# Patient Record
Sex: Male | Born: 1950 | Race: White | Hispanic: No | Marital: Married | Smoking: Former smoker
Health system: Southern US, Community
[De-identification: ages and names within clinical notes are randomized; demographics above are authoritative.]

## PROBLEM LIST (undated history)

## (undated) DIAGNOSIS — F419 Anxiety disorder, unspecified: Secondary | ICD-10-CM

## (undated) DIAGNOSIS — Z8489 Family history of other specified conditions: Secondary | ICD-10-CM

## (undated) DIAGNOSIS — N189 Chronic kidney disease, unspecified: Secondary | ICD-10-CM

## (undated) DIAGNOSIS — M109 Gout, unspecified: Secondary | ICD-10-CM

## (undated) DIAGNOSIS — E119 Type 2 diabetes mellitus without complications: Secondary | ICD-10-CM

## (undated) DIAGNOSIS — M1990 Unspecified osteoarthritis, unspecified site: Secondary | ICD-10-CM

## (undated) DIAGNOSIS — I10 Essential (primary) hypertension: Secondary | ICD-10-CM

## (undated) DIAGNOSIS — R7303 Prediabetes: Secondary | ICD-10-CM

## (undated) DIAGNOSIS — E7800 Pure hypercholesterolemia, unspecified: Secondary | ICD-10-CM

## (undated) DIAGNOSIS — D649 Anemia, unspecified: Secondary | ICD-10-CM

## (undated) DIAGNOSIS — G4730 Sleep apnea, unspecified: Secondary | ICD-10-CM

## (undated) HISTORY — PX: COLONOSCOPY: SHX174

## (undated) HISTORY — DX: Unspecified osteoarthritis, unspecified site: M19.90

## (undated) HISTORY — PX: CATARACT EXTRACTION: SUR2

---
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
## 2004-12-16 ENCOUNTER — Encounter (INDEPENDENT_AMBULATORY_CARE_PROVIDER_SITE_OTHER): Payer: Self-pay | Admitting: *Deleted

## 2004-12-16 ENCOUNTER — Ambulatory Visit (HOSPITAL_COMMUNITY): Admission: RE | Admit: 2004-12-16 | Discharge: 2004-12-16 | Payer: Self-pay | Admitting: Gastroenterology

## 2008-09-19 ENCOUNTER — Encounter: Admission: RE | Admit: 2008-09-19 | Discharge: 2008-10-04 | Payer: Self-pay | Admitting: Family Medicine

## 2013-07-02 ENCOUNTER — Emergency Department (HOSPITAL_COMMUNITY)
Admission: EM | Admit: 2013-07-02 | Discharge: 2013-07-02 | Disposition: A | Discharge status: Home / Self Care | Payer: BC Managed Care – PPO | Attending: Emergency Medicine | Admitting: Emergency Medicine

## 2013-07-02 ENCOUNTER — Encounter (HOSPITAL_COMMUNITY): Payer: Self-pay | Admitting: Emergency Medicine

## 2013-07-02 ENCOUNTER — Emergency Department (HOSPITAL_COMMUNITY): Payer: BC Managed Care – PPO

## 2013-07-02 DIAGNOSIS — Z8639 Personal history of other endocrine, nutritional and metabolic disease: Secondary | ICD-10-CM | POA: Insufficient documentation

## 2013-07-02 DIAGNOSIS — E7800 Pure hypercholesterolemia, unspecified: Secondary | ICD-10-CM | POA: Insufficient documentation

## 2013-07-02 DIAGNOSIS — N509 Disorder of male genital organs, unspecified: Secondary | ICD-10-CM | POA: Insufficient documentation

## 2013-07-02 DIAGNOSIS — R109 Unspecified abdominal pain: Secondary | ICD-10-CM | POA: Insufficient documentation

## 2013-07-02 DIAGNOSIS — Z862 Personal history of diseases of the blood and blood-forming organs and certain disorders involving the immune mechanism: Secondary | ICD-10-CM | POA: Insufficient documentation

## 2013-07-02 DIAGNOSIS — Z79899 Other long term (current) drug therapy: Secondary | ICD-10-CM | POA: Insufficient documentation

## 2013-07-02 DIAGNOSIS — K4090 Unilateral inguinal hernia, without obstruction or gangrene, not specified as recurrent: Secondary | ICD-10-CM

## 2013-07-02 DIAGNOSIS — E119 Type 2 diabetes mellitus without complications: Secondary | ICD-10-CM | POA: Insufficient documentation

## 2013-07-02 DIAGNOSIS — I10 Essential (primary) hypertension: Secondary | ICD-10-CM | POA: Insufficient documentation

## 2013-07-02 DIAGNOSIS — Z87891 Personal history of nicotine dependence: Secondary | ICD-10-CM | POA: Insufficient documentation

## 2013-07-02 HISTORY — DX: Type 2 diabetes mellitus without complications: E11.9

## 2013-07-02 HISTORY — DX: Gout, unspecified: M10.9

## 2013-07-02 HISTORY — DX: Essential (primary) hypertension: I10

## 2013-07-02 HISTORY — DX: Pure hypercholesterolemia, unspecified: E78.00

## 2013-07-02 LAB — URINALYSIS, ROUTINE W REFLEX MICROSCOPIC
Bilirubin Urine: NEGATIVE
Glucose, UA: 100 mg/dL — AB
Ketones, ur: NEGATIVE mg/dL
Nitrite: NEGATIVE
Protein, ur: >300 mg/dL — AB
Specific Gravity, Urine: 1.021 (ref 1.005–1.030)
Urobilinogen, UA: 0.2 mg/dL (ref 0.0–1.0)

## 2013-07-02 LAB — CBC WITH DIFFERENTIAL/PLATELET
Basophils Relative: 0 % (ref 0–1)
Eosinophils Absolute: 0.1 10*3/uL (ref 0.0–0.7)
Eosinophils Relative: 1 % (ref 0–5)
Hemoglobin: 14 g/dL (ref 13.0–17.0)
Lymphs Abs: 1.2 10*3/uL (ref 0.7–4.0)
MCH: 30.5 pg (ref 26.0–34.0)
MCHC: 34.6 g/dL (ref 30.0–36.0)
MCV: 88.2 fL (ref 78.0–100.0)
Monocytes Relative: 6 % (ref 3–12)
Neutrophils Relative %: 82 % — ABNORMAL HIGH (ref 43–77)
RBC: 4.59 MIL/uL (ref 4.22–5.81)
RDW: 13.5 % (ref 11.5–15.5)
WBC: 9.9 10*3/uL (ref 4.0–10.5)

## 2013-07-02 LAB — BASIC METABOLIC PANEL
BUN: 21 mg/dL (ref 6–23)
Calcium: 9.1 mg/dL (ref 8.4–10.5)
Creatinine, Ser: 1.02 mg/dL (ref 0.50–1.35)
GFR calc Af Amer: >90 mL/min (ref >90)
GFR calc non Af Amer: 78 mL/min — ABNORMAL LOW (ref >90)
Glucose, Bld: 176 mg/dL — ABNORMAL HIGH (ref 70–99)

## 2013-07-02 MED ORDER — HYDROMORPHONE HCL PF 1 MG/ML IJ SOLN
1.0000 mg | Freq: Once | INTRAMUSCULAR | Status: AC
  Administered 2013-07-02: 1 mg via INTRAVENOUS
  Filled 2013-07-02: qty 1

## 2013-07-02 MED ORDER — ONDANSETRON 8 MG PO TBDP: 8.0000 mg | ORAL_TABLET | Freq: Three times a day (TID) | ORAL | Status: DC | PRN

## 2013-07-02 MED ORDER — HYDROCODONE-ACETAMINOPHEN 5-325 MG PO TABS: 1.0000 | ORAL_TABLET | Freq: Four times a day (QID) | ORAL | Status: DC | PRN

## 2013-07-02 NOTE — ED Notes (Signed)
Patient discharged to home with family. NAD.  

## 2013-07-02 NOTE — ED Notes (Signed)
Pt reports right lower back pain that radiates around to right groin, started last night and become progressively worse. Denies difficulty urinating. No hx of kidney stones.

## 2013-07-02 NOTE — ED Notes (Signed)
Patient transported to CT 

## 2013-07-02 NOTE — ED Provider Notes (Signed)
CSN: [id number]     Arrival date & time 07/02/13  1048 History   First MD Initiated Contact with Patient 07/02/13 1200     Chief Complaint  Patient presents with  . Back Pain  . Groin Pain   (Consider location/radiation/quality/duration/timing/severity/associated sxs/prior Treatment) HPI Comments: PT comes in with cc of abd pain. Hx of diabetes. States that the abd pain is right sided flank pain, radiating to groin. Pain started yday, and on occasions has been pretty severe. No trauma, no uti like sx and no hx of renal stones.  Patient is a 62 y.o. male presenting with back pain and groin pain. The history is provided by the patient.  Back Pain Associated symptoms: abdominal pain   Associated symptoms: no chest pain, no dysuria and no fever   Groin Pain Associated symptoms include abdominal pain. Pertinent negatives include no chest pain and no shortness of breath.    Past Medical History  Diagnosis Date  . Diabetes mellitus without complication   . Hypertension   . High cholesterol   . Gout    History reviewed. No pertinent past surgical history. History reviewed. No pertinent family history. History  Substance Use Topics  . Smoking status: Former Games developer  . Smokeless tobacco: Not on file  . Alcohol Use: No    Review of Systems  Constitutional: Negative for fever, chills, activity change and appetite change.  Respiratory: Negative for cough and shortness of breath.   Cardiovascular: Negative for chest pain.  Gastrointestinal: Positive for abdominal pain. Negative for nausea and vomiting.  Genitourinary: Positive for testicular pain. Negative for dysuria and scrotal swelling.  Musculoskeletal: Positive for back pain.    Allergies  Review of patient's allergies indicates no known allergies.  Home Medications   Current Outpatient Rx  Name  Route  Sig  Dispense  Refill  . acetaminophen (TYLENOL) 500 MG tablet   Oral   Take 1,000 mg by mouth every 6 (six) hours as  needed.         Marland Kitchen allopurinol (ZYLOPRIM) 300 MG tablet   Oral   Take 300 mg by mouth daily.         Marland Kitchen atorvastatin (LIPITOR) 20 MG tablet   Oral   Take 20 mg by mouth daily.         . benazepril (LOTENSIN) 20 MG tablet   Oral   Take 20 mg by mouth daily.         . fenofibrate micronized (LOFIBRA) 200 MG capsule   Oral   Take 200 mg by mouth daily before breakfast.         . metFORMIN (GLUCOPHAGE) 500 MG tablet   Oral   Take 500 mg by mouth 2 (two) times daily with a meal.         . HYDROcodone-acetaminophen (NORCO/VICODIN) 5-325 MG per tablet   Oral   Take 1 tablet by mouth every 6 (six) hours as needed.   15 tablet   0   . ondansetron (ZOFRAN ODT) 8 MG disintegrating tablet   Oral   Take 1 tablet (8 mg total) by mouth every 8 (eight) hours as needed for nausea.   20 tablet   0    BP 144/68  Pulse 63  Temp(Src) 98.2 F (36.8 C) (Oral)  Resp 20  SpO2 97% Physical Exam  Nursing note and vitals reviewed. Constitutional: He is oriented to person, place, and time. He appears well-developed.  HENT:  Head: Normocephalic and atraumatic.  Eyes:  Conjunctivae and EOM are normal. Pupils are equal, round, and reactive to light.  Neck: Normal range of motion. Neck supple.  Cardiovascular: Normal rate and regular rhythm.   Pulmonary/Chest: Effort normal and breath sounds normal.  Abdominal: Soft. Bowel sounds are normal. He exhibits no distension. There is no tenderness. There is no rebound and no guarding.  No tenderness to palpation, no hernia appreciated.  Genitourinary: No penile tenderness.  No testicular mass, non tender exam, freely moving bilateral testes. No hernia that i could appreciate.  Neurological: He is alert and oriented to person, place, and time.  Skin: Skin is warm.    ED Course  Procedures (including critical care time) Labs Review Labs Reviewed  CBC WITH DIFFERENTIAL - Abnormal; Notable for the following:    Platelets 403 (*)     Neutrophils Relative % 82 (*)    Neutro Abs 8.1 (*)    All other components within normal limits  BASIC METABOLIC PANEL - Abnormal; Notable for the following:    Sodium 134 (*)    Glucose, Bld 176 (*)    GFR calc non Af Amer 78 (*)    All other components within normal limits  URINALYSIS, ROUTINE W REFLEX MICROSCOPIC - Abnormal; Notable for the following:    Glucose, UA 100 (*)    Hgb urine dipstick TRACE (*)    Protein, ur >300 (*)    All other components within normal limits  URINE MICROSCOPIC-ADD ON   Imaging Review Ct Abdomen Pelvis Wo Contrast  07/02/2013   CLINICAL DATA:  Right-sided back and groin pain.  EXAM: CT ABDOMEN AND PELVIS WITHOUT CONTRAST  TECHNIQUE: Multidetector CT imaging of the abdomen and pelvis was performed following the standard protocol without intravenous contrast.  COMPARISON:  None.  FINDINGS: The lung bases are clear except for dependent bibasilar atelectasis. No worrisome pulmonary lesions or pleural effusion. The heart is normal in size. No pericardial effusion. The distal esophagus is unremarkable.  There is diffuse and fairly marked fatty infiltration of the liver but no focal hepatic lesions or intrahepatic biliary dilatation. The gallbladder demonstrates high attenuation material which could be vicarious excretion of contrast material or sludge. No inflammatory changes. The common bile duct is normal in caliber. The pancreas is unremarkable. The spleen is normal in size. No focal lesions. The adrenal glands are normal. Both kidneys demonstrate duplicated collecting systems and fever lobulations or scarring changes. The right kidney is small and the left kidney demonstrates compensatory hypertrophy. No renal calculi or obstructing ureteral calculi. No worrisome renal lesions. The bladder demonstrates mild diffuse wall thickening but no mass or calculus. The prostate gland and seminal vesicles are unremarkable.  The stomach, duodenum, small bowel and colon are  grossly normal without oral contrast. No inflammatory changes, mass lesions or obstructive findings. Scattered colonic diverticulosis is noted. The appendix is not identified but no findings to suggest appendicitis. No mesenteric or retroperitoneal mass or adenopathy. The aorta is normal in caliber. Scattered atherosclerotic calcifications are noted.  No pelvic mass, adenopathy or free pelvic fluid collections. No inguinal mass or adenopathy. There is a left inguinal hernia versus prominent inguinal canal containing fat.  The bony structures are unremarkable. Moderate degenerative changes noted in the lower lumbar spine and involving the SI joints.  IMPRESSION: 1. No acute abdominal/pelvic findings, mass lesions or lymphadenopathy. 2. No renal or obstructing ureteral calculi. 3. Small right kidney and compensatory hypertrophy of the left kidney. Pubic 80 collecting systems bilaterally. 4. Diffuse fatty infiltration of  the liver   Electronically Signed   By: Loralie Champagne M.D.   On: 07/02/2013 14:16    EKG Interpretation   None       MDM   1. Inguinal hernia    Pt comes in with cc of flank pan, right sided, with radiation to groin. Pain severe on occasion. No hx of renal stones. Exam is non peritoneal. GU exam was benign, no testicular torsion, epididymitis suspected. Pt's CT showed no renal stones. There is a possible There is a left inguinal hernia versus prominent inguinal canal containing fat per CT - and could be the cause of the pain. Will give surgery follow up to be on the safe side, as there is nothing that truly can explain this pain otherwise. Return precautions discussed with the patient verbally.    Derwood Kaplan, MD 07/02/13 [phone]

## 2013-07-10 ENCOUNTER — Ambulatory Visit (INDEPENDENT_AMBULATORY_CARE_PROVIDER_SITE_OTHER): Payer: BC Managed Care – PPO | Admitting: Surgery

## 2013-07-10 ENCOUNTER — Encounter (INDEPENDENT_AMBULATORY_CARE_PROVIDER_SITE_OTHER): Payer: Self-pay | Admitting: Surgery

## 2013-07-10 VITALS — BP 140/88 | HR 80 | Temp 98.6°F | Resp 14 | Ht 71.0 in | Wt 283.6 lb

## 2013-07-10 DIAGNOSIS — K4090 Unilateral inguinal hernia, without obstruction or gangrene, not specified as recurrent: Secondary | ICD-10-CM

## 2013-07-10 NOTE — Progress Notes (Signed)
Patient ID: Dwayne Gomez, male   DOB: [birth year], 62 y.o.   MRN: [id number]  Chief Complaint  Patient presents with  . New Evaluation    eval RIH    HPI Dwayne Gomez is a 62 y.o. male.  Patient sent at the request of Dr. Rosezetta Schlatter for right groin and right flank pain. He was seen a few weeks ago due to the sudden onset of lower right back pain with radiation into his right groin and right testicle. This came on suddenly without any precipitating event. He underwent CT scanning which showed no kidney stone and no explanation for the pain. He was found to have a right inguinal hernia. His pain improved with rest and now he is pain free. He does notice some swelling in his right groin. He is not having any significant groin pain at this point in time. He does have a history of back problems he states. HPI  Past Medical History  Diagnosis Date  . Diabetes mellitus without complication   . Hypertension   . High cholesterol   . Gout   . Arthritis     Past Surgical History  Procedure Laterality Date  . Appendectomy  [id number]    Family History  Problem Relation Age of Onset  . Cancer Mother     breast  . Cancer Father     prostate  . Diabetes Father     Social History History  Substance Use Topics  . Smoking status: Former Smoker    Quit date: 07/28/1975  . Smokeless tobacco: Never Used  . Alcohol Use: Yes    No Known Allergies  Current Outpatient Prescriptions  Medication Sig Dispense Refill  . acetaminophen (TYLENOL) 500 MG tablet Take 1,000 mg by mouth every 6 (six) hours as needed.      Marland Kitchen allopurinol (ZYLOPRIM) 300 MG tablet Take 300 mg by mouth daily.      Marland Kitchen atorvastatin (LIPITOR) 20 MG tablet Take 20 mg by mouth daily.      . benazepril (LOTENSIN) 20 MG tablet Take 20 mg by mouth daily.      . fenofibrate micronized (LOFIBRA) 200 MG capsule Take 200 mg by mouth daily before breakfast.      . HYDROcodone-acetaminophen (NORCO/VICODIN) 5-325 MG per tablet Take 1  tablet by mouth every 6 (six) hours as needed.  15 tablet  0  . indomethacin (INDOCIN) 25 MG capsule Take 25 mg by mouth 2 (two) times daily with a meal.      . metFORMIN (GLUCOPHAGE) 500 MG tablet Take 500 mg by mouth 2 (two) times daily with a meal.       No current facility-administered medications for this visit.    Review of Systems Review of Systems  Constitutional: Negative for fever, chills and unexpected weight change.  HENT: Negative for congestion, hearing loss, sore throat, trouble swallowing and voice change.   Eyes: Negative for visual disturbance.  Respiratory: Negative for cough and wheezing.   Cardiovascular: Negative for chest pain, palpitations and leg swelling.  Gastrointestinal: Negative for nausea, vomiting, abdominal pain, diarrhea, constipation, blood in stool, abdominal distention, anal bleeding and rectal pain.  Genitourinary: Negative for hematuria and difficulty urinating.  Musculoskeletal: Positive for back pain. Negative for arthralgias.  Skin: Negative for rash and wound.  Neurological: Negative for seizures, syncope, weakness and headaches.  Hematological: Negative for adenopathy. Does not bruise/bleed easily.  Psychiatric/Behavioral: Negative for confusion.    Blood pressure 140/88, pulse 80, temperature 98.6 F (37  C), temperature source Temporal, resp. rate 14, height 5\' 11"  (1.803 m), weight 283 lb 9.6 oz (128.64 kg).  Physical Exam Physical Exam  Constitutional: He appears well-developed and well-nourished.  HENT:  Head: Normocephalic and atraumatic.  Eyes: EOM are normal. Pupils are equal, round, and reactive to light. No scleral icterus.  Neck: Normal range of motion. Neck supple.  Cardiovascular: Normal rate and regular rhythm.   Pulmonary/Chest: Effort normal.  Abdominal: Soft. Bowel sounds are normal. There is no tenderness. A hernia is present. Hernia confirmed positive in the right inguinal area. Hernia confirmed negative in the left  inguinal area.  Musculoskeletal: Normal range of motion.  Neurological: He is alert.  Skin: Skin is warm and dry.  Psychiatric: He has a normal mood and affect. His behavior is normal. Judgment and thought content normal.    Data Reviewed CT A/P  Assessment    RIH small reducible    Plan    Discussed observation versus surgical repair mesh. The hernia small and the pain he had that prompted his visit to the emergency room may be radicular in nature. No evidence of left inguinal hernia on exam. CT scan shows no hernia as well. He will think over surgery and let me know if if he wishes to proceed. Information about open hernia repair with mesh given and questions answered. Risks, benefits and alternatives to surgery given as well.The risk of hernia repair include bleeding,  Infection,   Recurrence of the hernia,  Mesh use, chronic pain,  Organ injury,  Bowel injury,  Bladder injury,   nerve injury with numbness around the incision,  Death,  and worsening of preexisting  medical problems.  The alternatives to surgery have been discussed as well..  Long term expectations of both operative and non operative treatments have been discussed.   Pt will call to schedule. Marland Kitchen       Dwayne Gomez A. 07/10/2013, 11:49 AM

## 2013-07-10 NOTE — Patient Instructions (Signed)
Hernia, Surgical Repair A hernia occurs when an internal organ pushes out through a weak spot in the belly (abdominal) wall muscles. Hernias commonly occur in the groin and around the navel. Hernias often can be pushed back into place (reduced). Most hernias tend to get worse over time. Problems occur when abdominal contents get stuck in the opening (incarcerated hernia). The blood supply gets cut off (strangulated hernia). This is an emergency and needs surgery. Otherwise, hernia repair can be an elective procedure. This means you can schedule this at your convenience when an emergency is not present. Because complications can occur, if you decide to repair the hernia, it is best to do it soon. When it becomes an emergency procedure, there is increased risk of complications after surgery. CAUSES   Heavy lifting.  Obesity.  Prolonged coughing.  Straining to move your bowels.  Hernias can also occur through a cut (incision) by a surgeonafter an abdominal operation. HOME CARE INSTRUCTIONS Before the repair:  Bed rest is not required. You may continue your normal activities, but avoid heavy lifting (more than 10 pounds) or straining. Cough gently. If you are a smoker, it is best to stop. Even the best hernia repair can break down with the continual strain of coughing.  Do not wear anything tight over your hernia. Do not try to keep it in with an outside bandage or truss. These can damage abdominal contents if they are trapped in the hernia sac.  Eat a normal diet. Avoid constipation. Straining over long periods of time to have a bowel movement will increase hernia size. It also can breakdown repairs. If you cannot do this with diet alone, laxatives or stool softeners may be used. PRIOR TO SURGERY, SEEK IMMEDIATE MEDICAL CARE IF: You have problems (symptoms) of a trapped (incarcerated) hernia. Symptoms include:  An oral temperature above 102 F (38.9 C) develops, or as your caregiver  suggests.  Increasing abdominal pain.  Feeling sick to your stomach(nausea) and vomiting.  You stop passing gas or stool.  The hernia is stuck outside the abdomen, looks discolored, feels hard, or is tender.  You have any changes in your bowel habits or in the hernia that is unusual for you. LET YOUR CAREGIVERS KNOW ABOUT THE FOLLOWING:  Allergies.  Medications taken including herbs, eye drops, over the counter medications, and creams.  Use of steroids (by mouth or creams).  Family or personal history of problems with anesthetics or Novocaine.  Possibility of pregnancy, if this applies.  Personal history of blood clots (thrombophlebitis).  Family or personal history of bleeding or blood problems.  Previous surgery.  Other health problems. BEFORE THE PROCEDURE You should be present 1 hour prior to your procedure, or as directed by your caregiver.  AFTER THE PROCEDURE After surgery, you will be taken to the recovery area. A nurse will watch and check your progress there. Once you are awake, stable, and taking fluids well, you will be allowed to go home as long as there are no problems. Once home, an ice pack (wrapped in a light towel) applied to your operative site may help with discomfort. It may also keep the swelling down. Do not lift anything heavier than 10 pounds (4.55 kilograms). Take showers not baths. Do not drive while taking narcotics. Follow instructions as suggested by your caregiver.  SEEK IMMEDIATE MEDICAL CARE IF: After surgery:  There is redness, swelling, or increasing pain in the wound.  There is pus coming from the wound.  There is  drainage from a wound lasting longer than 1 day.  An unexplained oral temperature above 102 F (38.9 C) develops.  You notice a foul smell coming from the wound or dressing.  There is a breaking open of a wound (edged not staying together) after the sutures have been removed.  You notice increasing pain in the shoulders  (shoulder strap areas).  You develop dizzy episodes or fainting while standing.  You develop persistent nausea or vomiting.  You develop a rash.  You have difficulty breathing.  You develop any reaction or side effects to medications given. MAKE SURE YOU:   Understand these instructions.  Will watch your condition.  Will get help right away if you are not doing well or get worse. Document Released: 01/06/2001 Document Revised: 10/05/2011 Document Reviewed: 11/29/2007 [facility] Patient Information 2014 [place], [place]. Inguinal Hernia, Adult  Care After Refer to this sheet in the next few weeks. These discharge instructions provide you with general information on caring for yourself after you leave the hospital. Your caregiver may also give you specific instructions. Your treatment has been planned according to the most current medical practices available, but unavoidable complications sometimes occur. If you have any problems or questions after discharge, please call your caregiver. HOME CARE INSTRUCTIONS  Put ice on the operative site.  Put ice in a plastic bag.  Place a towel between your skin and the bag.  Leave the ice on for 15-20 minutes at a time, 03-04 times a day while awake.  Change bandages (dressings) as directed.  Keep the wound dry and clean. The wound may be washed gently with soap and water. Gently blot or dab the wound dry. It is okay to take showers 24 to 48 hours after surgery. Do not take baths, use swimming pools, or use hot tubs for 10 days, or as directed by your caregiver.  Only take over-the-counter or prescription medicines for pain, discomfort, or fever as directed by your caregiver.  Continue your normal diet as directed.  Do not lift anything more than 10 pounds or play contact sports for 3 weeks, or as directed. SEEK MEDICAL CARE IF:  There is redness, swelling, or increasing pain in the wound.  There is fluid (pus) coming from the  wound.  There is drainage from a wound lasting longer than 1 day.  You have an oral temperature above 102 F (38.9 C).  You notice a bad smell coming from the wound or dressing.  The wound breaks open after the stitches (sutures) have been removed.  You notice increasing pain in the shoulders (shoulder strap areas).  You develop dizzy episodes or fainting while standing.  You feel sick to your stomach (nauseous) or throw up (vomit). SEEK IMMEDIATE MEDICAL CARE IF:  You develop a rash.  You have difficulty breathing.  You develop a reaction or have side effects to medicines you were given. MAKE SURE YOU:   Understand these instructions.  Will watch your condition.  Will get help right away if you are not doing well or get worse. Document Released: 08/13/2006 Document Revised: 10/05/2011 Document Reviewed: 06/12/2009 [facility] Patient Information 2014 [place], [place].

## 2017-06-25 ENCOUNTER — Other Ambulatory Visit: Payer: Self-pay | Admitting: Family Medicine

## 2017-06-25 ENCOUNTER — Ambulatory Visit
Admission: RE | Admit: 2017-06-25 | Discharge: 2017-06-25 | Disposition: A | Discharge status: Home / Self Care | Payer: BC Managed Care – PPO | Source: Ambulatory Visit | Attending: Family Medicine | Admitting: Family Medicine

## 2017-06-25 DIAGNOSIS — S298XXA Other specified injuries of thorax, initial encounter: Secondary | ICD-10-CM

## 2019-08-17 ENCOUNTER — Ambulatory Visit: Payer: Medicare PPO | Attending: Internal Medicine

## 2019-08-17 DIAGNOSIS — Z23 Encounter for immunization: Secondary | ICD-10-CM | POA: Insufficient documentation

## 2019-08-17 NOTE — Progress Notes (Signed)
   Covid-19 Vaccination Clinic  Name:  Dwayne Gomez    MRN: [id number] DOB: 1951/02/02  08/17/2019  [name] was observed post Covid-19 immunization for 15 minutes without incidence. He was provided with Vaccine Information Sheet and instruction to access the V-Safe system.   [name] was instructed to call 911 with any severe reactions post vaccine: Marland Kitchen Difficulty breathing  . Swelling of your face and throat  . A fast heartbeat  . A bad rash all over your body  . Dizziness and weakness    Immunizations Administered    Name Date Dose VIS Date Route   Pfizer COVID-19 Vaccine 08/17/2019  4:52 PM 0.3 mL 07/07/2019 Intramuscular   Manufacturer: [employer], [employer]   Lot: [id number]   NDC: [address]-7

## 2019-09-07 ENCOUNTER — Ambulatory Visit: Payer: Medicare PPO | Attending: Internal Medicine

## 2019-09-07 DIAGNOSIS — Z23 Encounter for immunization: Secondary | ICD-10-CM | POA: Insufficient documentation

## 2019-09-07 NOTE — Progress Notes (Signed)
   Covid-19 Vaccination Clinic  Name:  Dwayne Gomez    MRN: [id number] DOB: [birth year]  09/07/2019  Mr. [name] was observed post Covid-19 immunization for 15 minutes without incidence. He was provided with Vaccine Information Sheet and instruction to access the V-Safe system.   Mr. [name] was instructed to call 911 with any severe reactions post vaccine: Marland Kitchen Difficulty breathing  . Swelling of your face and throat  . A fast heartbeat  . A bad rash all over your body  . Dizziness and weakness    Immunizations Administered    Name Date Dose VIS Date Route   Pfizer COVID-19 Vaccine 09/07/2019  4:31 PM 0.3 mL 07/07/2019 Intramuscular   Manufacturer: [employer], [employer]   Lot: [id number]   NDC: [address]-9

## 2020-05-13 ENCOUNTER — Other Ambulatory Visit: Payer: Self-pay | Admitting: Otolaryngology

## 2020-05-13 DIAGNOSIS — H903 Sensorineural hearing loss, bilateral: Secondary | ICD-10-CM

## 2020-06-02 ENCOUNTER — Ambulatory Visit
Admission: RE | Admit: 2020-06-02 | Discharge: 2020-06-02 | Disposition: A | Discharge status: Home / Self Care | Payer: Medicare PPO | Source: Ambulatory Visit | Attending: Otolaryngology | Admitting: Otolaryngology

## 2020-06-02 ENCOUNTER — Other Ambulatory Visit: Payer: Self-pay

## 2020-06-02 DIAGNOSIS — H903 Sensorineural hearing loss, bilateral: Secondary | ICD-10-CM

## 2020-06-24 ENCOUNTER — Other Ambulatory Visit: Payer: Self-pay | Admitting: Otolaryngology

## 2020-06-24 DIAGNOSIS — H903 Sensorineural hearing loss, bilateral: Secondary | ICD-10-CM

## 2020-07-17 ENCOUNTER — Ambulatory Visit
Admission: RE | Admit: 2020-07-17 | Discharge: 2020-07-17 | Disposition: A | Discharge status: Home / Self Care | Payer: Medicare PPO | Source: Ambulatory Visit | Attending: Otolaryngology | Admitting: Otolaryngology

## 2020-07-17 ENCOUNTER — Other Ambulatory Visit: Payer: Medicare PPO

## 2020-07-17 DIAGNOSIS — H903 Sensorineural hearing loss, bilateral: Secondary | ICD-10-CM

## 2020-07-17 MED ORDER — GADOBENATE DIMEGLUMINE 529 MG/ML IV SOLN
20.0000 mL | Freq: Once | INTRAVENOUS | Status: AC | PRN
  Administered 2020-07-17: 20 mL via INTRAVENOUS

## 2021-01-20 IMAGING — MR MR BRAIN/IAC WO/W CM
12 of 13 series · 40 of 48 positions shown · IV contrast (multihance)
Comparison: None.

CLINICAL DATA: Left hearing loss

EXAM:
MRI HEAD WITHOUT AND WITH CONTRAST
TECHNIQUE: Multiplanar, multiecho pulse sequences of the brain and surrounding
structures were obtained without and with intravenous contrast.
CONTRAST:  20mL MULTIHANCE GADOBENATE DIMEGLUMINE 529 MG/ML IV SOLN

[Series 2: T1 · sagittal · 5.0mm · 0.47mm/px · 3 of 21 slices shown (1 of 4)]
[im 1/21]
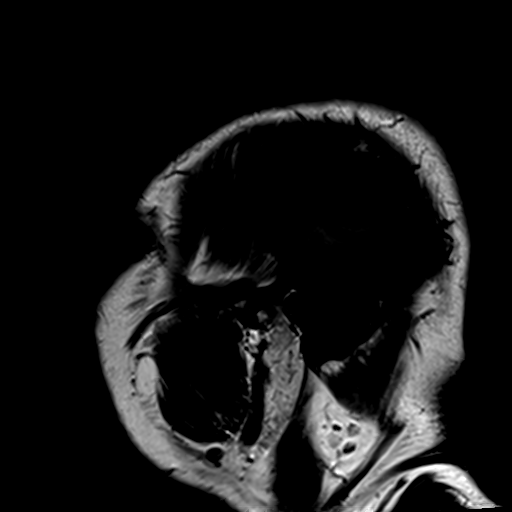
[im 11/21]
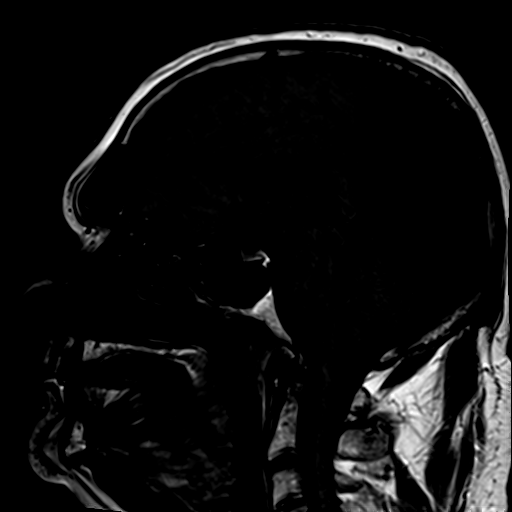
[im 21/21]
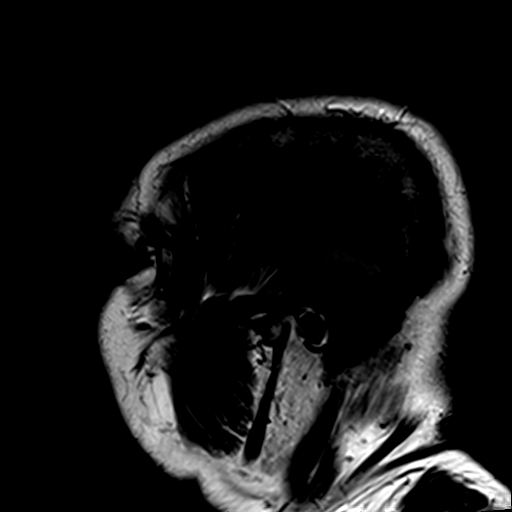

[Series 3: ep2d_diff_3 · axial · 3.0mm · 1.95mm/px · z∈[-34,+113]mm · 8 of 100 slices shown]
[im 1/100]
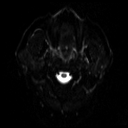
[im 13/100]
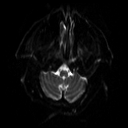
[im 25/100]
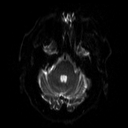
[im 38/100]
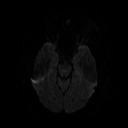
[im 62/100]
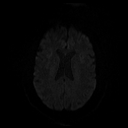
[im 75/100]
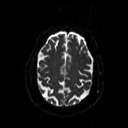
[im 87/100]
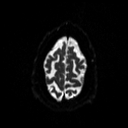
[im 100/100]
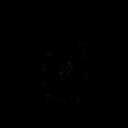

[Series 4: ep2d_diff_3_adc · axial · 3.0mm · 1.95mm/px · z∈[-34,+113]mm · 4 of 50 slices shown]
[im 1/50]
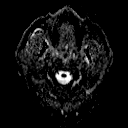
[im 17/50]
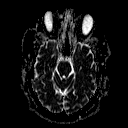
[im 33/50]
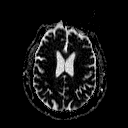
[im 50/50]
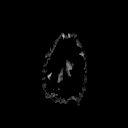

[Series 5: t2_tse_tra_512 · axial · 5.0mm · 0.62mm/px · z∈[-33,+111]mm · 2 of 24 slices shown]
[im 1/24]
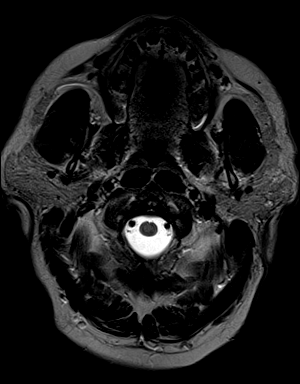
[im 24/24]
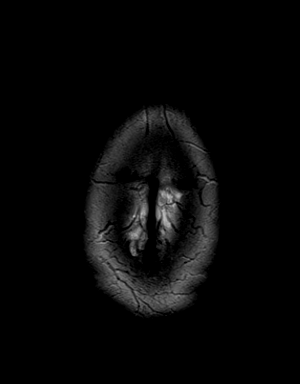

[Series 6: FLAIR · axial · 3.0mm · 0.47mm/px · z∈[-39,+117]mm · 2 of 27 slices shown]
[im 1/27]
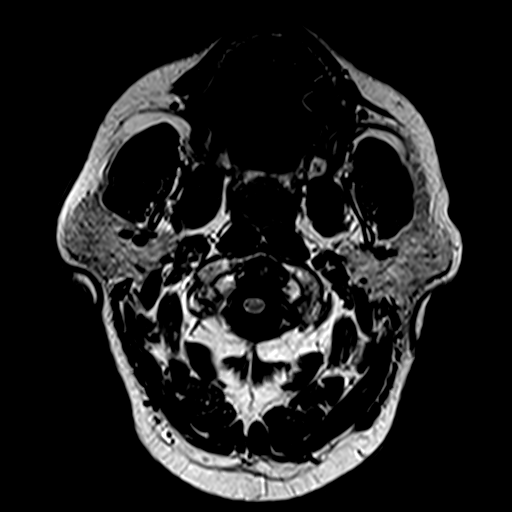
[im 27/27]
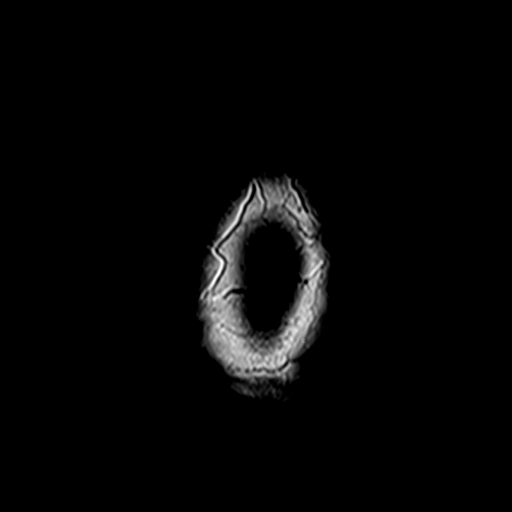

[Series 7: mip_images(sw) · axial · 16.0mm · 0.94mm/px · z∈[-33,+111]mm · 6 of 73 slices shown]
[im 1/73]
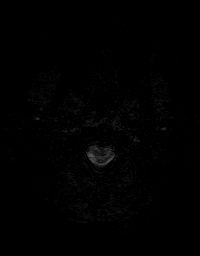
[im 15/73]
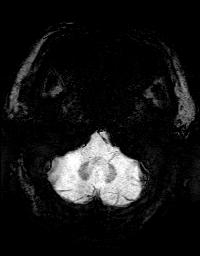
[im 29/73]
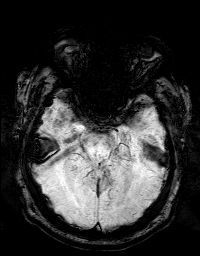
[im 44/73]
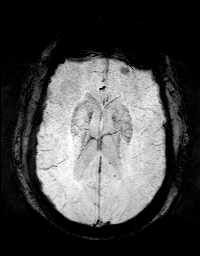
[im 58/73]
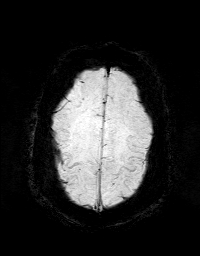
[im 73/73]
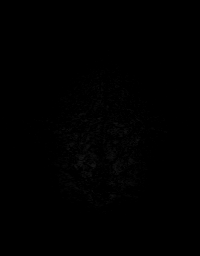

[Series 8: swi_images · axial · 2.0mm · 0.94mm/px · z∈[-40,+118]mm · 7 of 80 slices shown]
[im 1/80]
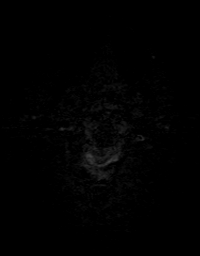
[im 14/80]
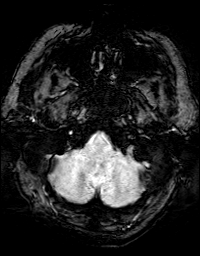
[im 27/80]
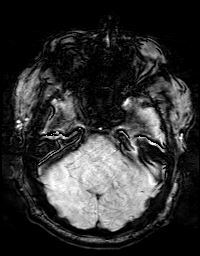
[im 40/80]
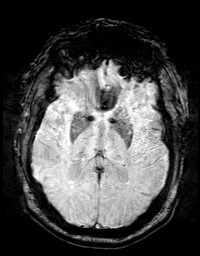
[im 53/80]
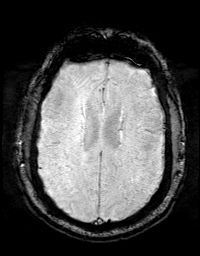
[im 66/80]
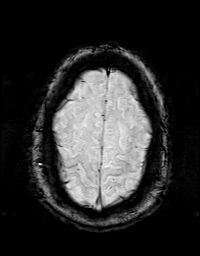
[im 80/80]
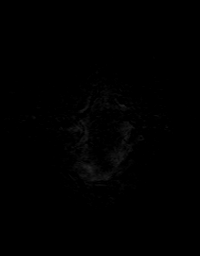

[Series 9: T1 · coronal · 3.0mm · 0.35mm/px · 1 of 11 slices shown (2 of 4)]
[im 1/11]
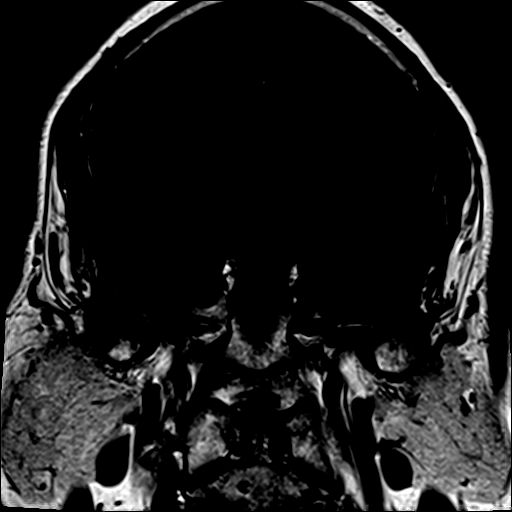

[Series 10: T1 · axial · 3.0mm · 0.35mm/px · 1 of 11 slices shown (3 of 4)]
[im 1/11]
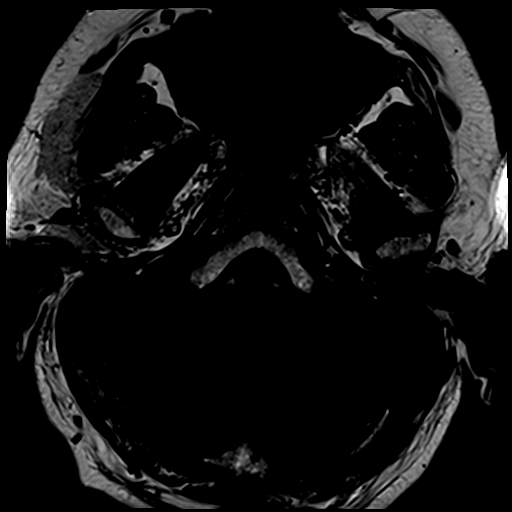

[Series 11: bSSFP · axial · 0.7mm · 0.28mm/px · z∈[-19,+11]mm · 4 of 44 slices shown]
[im 1/44]
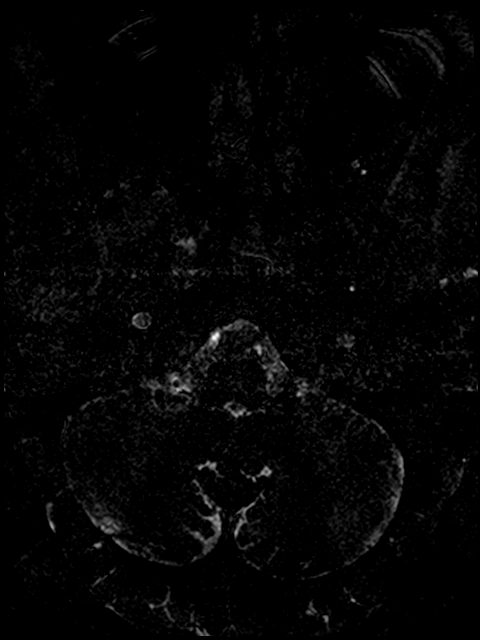
[im 15/44]
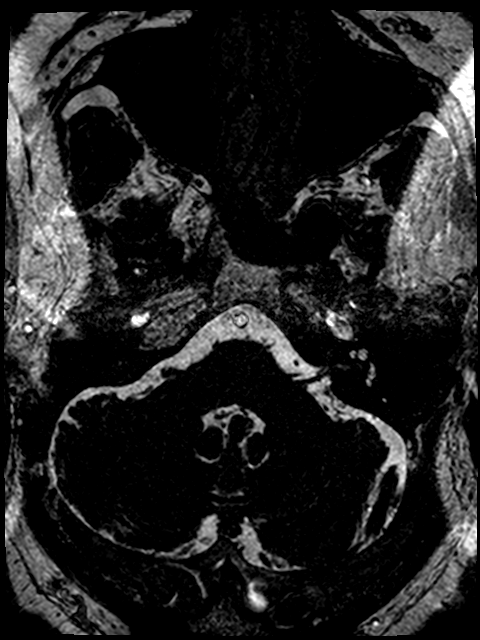
[im 29/44]
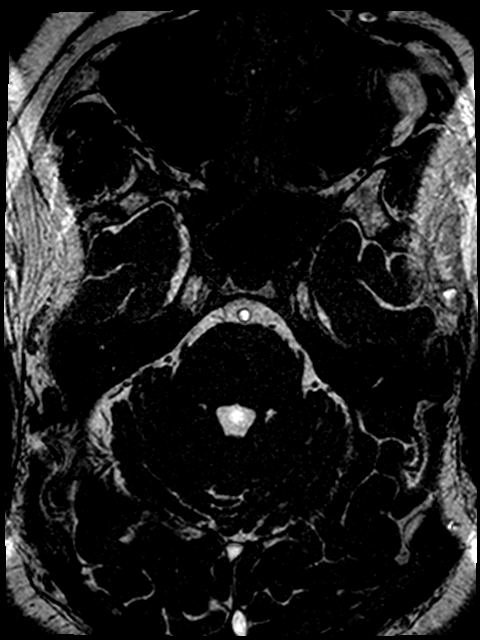
[im 44/44]
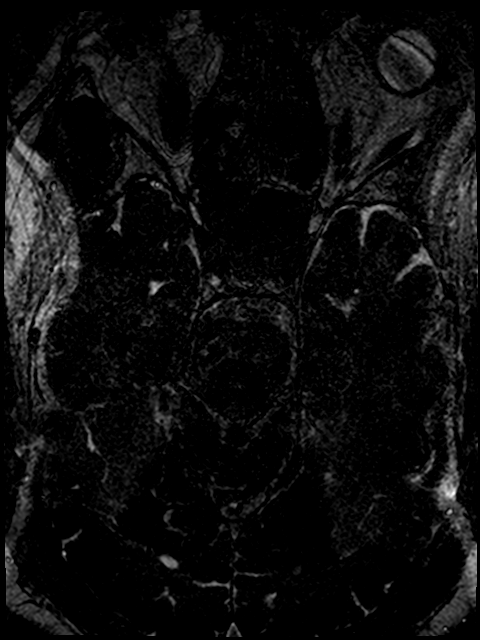

[Series 12: T1 · coronal · 3.0mm · 0.35mm/px · 1 of 11 slices shown (4 of 4)]
[im 1/11]
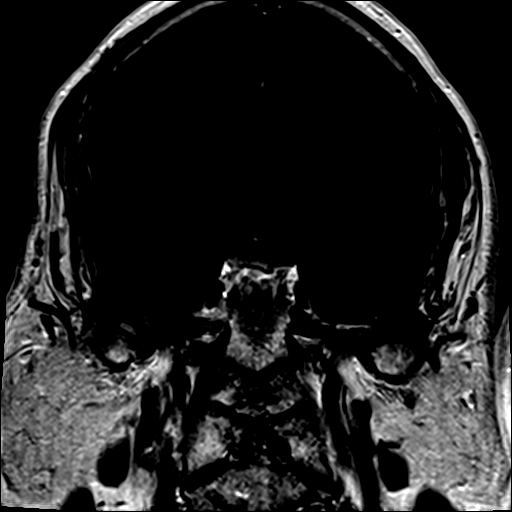

[Series 13: T1 post-contrast · axial · 3.0mm · 0.35mm/px · 1 of 11 slices shown]
[im 1/11]
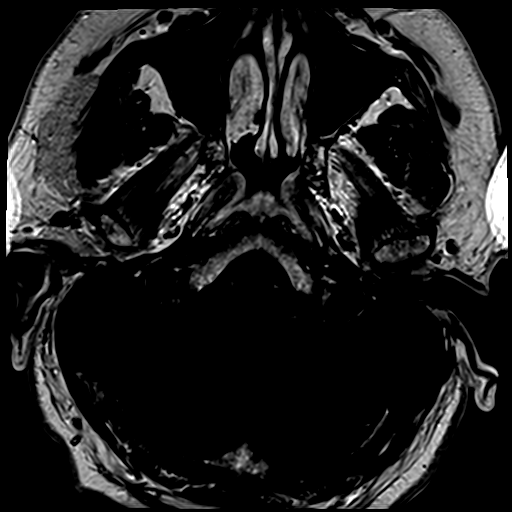

[40 of 48 positions shown; findings below may reference images not displayed]

FINDINGS: Brain: There is no cerebellopontine angle mass. Inner ear structures
demonstrate an unremarkable MR appearance. There is no abnormal
enhancement within the internal auditory canals.

No acute infarction or intracranial hemorrhage. There is no mass
effect or edema. There is no extra-axial fluid collection. Minimal
scattered small foci of T2 hyperintensity in the supratentorial
white matter are nonspecific but could reflect minor chronic
microvascular ischemic changes. Ventricles and sulci are within
normal limits in size and configuration.

Vascular: Major vessel flow voids at the skull base are preserved.

Skull and upper cervical spine: Normal marrow signal is preserved.

Sinuses/Orbits: Mild mucosal thickening. Bilateral lens
replacements.

Other: The sella is unremarkable.  Mastoid air cells are clear.
IMPRESSION: No mass or abnormal enhancement. Minor chronic microvascular
ischemic changes.

## 2021-02-04 NOTE — Progress Notes (Signed)
02/05/21- 72 yoM former smoker for sleep evaluation courtesy of Carola Frost, NP with concern of OSA Medical problem list includes OSA, HTN, DM2,  CKD3, Gout Epworth score-) Body weight today- Covid vax-4 Phizer -----Patient has OSA and is needing a new sleep study and new cpap machine Sleep study was 18-20 years ago and machine is saying motor has reached end. He has no local DME.  Denies ENT surgery, heart or lung disease. He is satisfied that life is better with CPAP. Prior he snored loudly and couldn't stay awake. No sleep meds. 1-2 cups coffee.  Prior to Admission medications   Medication Sig Start Date End Date Taking? Authorizing Provider  acetaminophen (TYLENOL) 500 MG tablet Take 1,000 mg by mouth every 6 (six) hours as needed.    [provider]  allopurinol (ZYLOPRIM) 300 MG tablet Take 300 mg by mouth daily.    [provider]  atorvastatin (LIPITOR) 20 MG tablet Take 20 mg by mouth daily.    [provider]  benazepril (LOTENSIN) 20 MG tablet Take 20 mg by mouth daily.    [provider]  fenofibrate micronized (LOFIBRA) 200 MG capsule Take 200 mg by mouth daily before breakfast.    [provider]  HYDROcodone-acetaminophen (NORCO/VICODIN) 5-325 MG per tablet Take 1 tablet by mouth every 6 (six) hours as needed. 07/02/13   Derwood Kaplan, MD  indomethacin (INDOCIN) 25 MG capsule Take 25 mg by mouth 2 (two) times daily with a meal.    [provider]  metFORMIN (GLUCOPHAGE) 500 MG tablet Take 500 mg by mouth 2 (two) times daily with a meal.    [provider]   Past Medical History:  Diagnosis Date   Arthritis    Diabetes mellitus without complication (HCC)    Gout    High cholesterol    Hypertension    Past Surgical History:  Procedure Laterality Date   APPENDECTOMY  03-1967   Family History  Problem Relation Age of Onset   Cancer Mother        breast   Cancer Father        prostate   Diabetes  Father    Social History   Socioeconomic History   Marital status: Married    Spouse name: Not on file   Number of children: Not on file   Years of education: Not on file   Highest education level: Not on file  Occupational History   Not on file  Tobacco Use   Smoking status: Former    Packs/day: 1.00    Years: 3.00    Pack years: 3.00    Types: Cigarettes    Start date: 59    Quit date: 07/28/1975    Years since quitting: 45.5   Smokeless tobacco: Never  Vaping Use   Vaping Use: Never used  Substance and Sexual Activity   Alcohol use: Yes   Drug use: No   Sexual activity: Not on file  Other Topics Concern   Not on file  Social History Narrative   Not on file   Social Determinants of Health   Financial Resource Strain: Not on file  Food Insecurity: Not on file  Transportation Needs: Not on file  Physical Activity: Not on file  Stress: Not on file  Social Connections: Not on file  Intimate Partner Violence: Not on file   ROS-see HPI  + = positive Constitutional:    weight loss, night sweats, fevers, chills, fatigue, lassitude. HEENT:  headaches, difficulty swallowing, tooth/dental problems, sore throat,       sneezing, itching, ear ache, nasal congestion, post nasal drip, snoring CV:    chest pain, orthopnea, PND, swelling in lower extremities, anasarca,                                   dizziness, palpitations Resp:   shortness of breath with exertion or at rest.                productive cough,   non-productive cough, coughing up of blood.              change in color of mucus.  wheezing.   Skin:    rash or lesions. GI:  No-   heartburn, indigestion, abdominal pain, nausea, vomiting, diarrhea,                 change in bowel habits, loss of appetite GU: dysuria, change in color of urine, no urgency or frequency.   flank pain. MS:   joint pain, stiffness, decreased range of motion, back pain. Neuro-     nothing unusual Psych:  change in mood or affect.   depression or anxiety.   memory loss.  OBJ- Physical Exam General- Alert, Oriented, Affect-appropriate, Distress- none acute, + obese Skin- rash-none, lesions- none, excoriation- none Lymphadenopathy- none Head- atraumatic            Eyes- Gross vision intact, PERRLA, conjunctivae and secretions clear            Ears- Hearing, canals-normal            Nose- Clear, no-Septal dev, mucus, polyps, erosion, perforation             Throat- Mallampati IV , mucosa clear , drainage- none, tonsils- atrophic, + teeth Neck- flexible , trachea midline, no stridor , thyroid nl, carotid no bruit Chest - symmetrical excursion , unlabored           Heart/CV- RRR , no murmur , no gallop  , no rub, nl s1 s2                           - JVD- none , edema- none, stasis changes- none, varices- none           Lung- clear to P&A, wheeze- none, cough- none , dullness-none, rub- none           Chest wall-  Abd-  Br/ Gen/ Rectal- Not done, not indicated Extrem- cyanosis- none, clubbing, none, atrophy- none, strength- nl Neuro- grossly intact to observation

## 2021-02-05 ENCOUNTER — Ambulatory Visit: Payer: Medicare PPO | Admitting: Internal Medicine

## 2021-02-05 ENCOUNTER — Encounter: Payer: Self-pay | Admitting: Internal Medicine

## 2021-02-05 ENCOUNTER — Other Ambulatory Visit: Payer: Self-pay

## 2021-02-05 VITALS — BP 150/62 | HR 72 | Temp 98.1°F | Ht 71.0 in | Wt 294.0 lb

## 2021-02-05 DIAGNOSIS — G4733 Obstructive sleep apnea (adult) (pediatric): Secondary | ICD-10-CM | POA: Diagnosis not present

## 2021-02-05 NOTE — Patient Instructions (Signed)
Order- schedule home sleep test dx OSA  Please call us for results and recommendations about 2 weeks after your sleep test. We should be able to get you a new DME company and new CPAP machine. Insurance usually requires [place] to see you again about 3 months after you actually get your new machine.

## 2021-02-09 DIAGNOSIS — G4733 Obstructive sleep apnea (adult) (pediatric): Secondary | ICD-10-CM | POA: Insufficient documentation

## 2021-02-09 NOTE — Assessment & Plan Note (Signed)
He we need to update documentation. Plan- sleep study then new DME, replace old CPAP- probably auto 5-20

## 2021-02-09 NOTE — Assessment & Plan Note (Signed)
Discussed relation of obesity to OSA Plan- encourage normal body weight- BMI < 30

## 2021-03-03 ENCOUNTER — Other Ambulatory Visit: Payer: Self-pay

## 2021-03-03 ENCOUNTER — Ambulatory Visit: Payer: Medicare PPO

## 2021-03-03 DIAGNOSIS — G4733 Obstructive sleep apnea (adult) (pediatric): Secondary | ICD-10-CM

## 2021-03-07 DIAGNOSIS — G4733 Obstructive sleep apnea (adult) (pediatric): Secondary | ICD-10-CM | POA: Diagnosis not present

## 2021-04-01 ENCOUNTER — Telehealth: Payer: Self-pay | Admitting: Internal Medicine

## 2021-04-01 DIAGNOSIS — G4733 Obstructive sleep apnea (adult) (pediatric): Secondary | ICD-10-CM

## 2021-04-01 NOTE — Telephone Encounter (Signed)
Home sleep test showed severe obstructive sleep apnea, averaging 89 apneas/ hour with drops in blood oxygen level.  Please order new DME, new CPAP to replace old machine with worn out motor.  Auto 5-20, mask of choice humidifier, supplies, AirView/ card  Needs return ov with me or APP in 31- 90 days after he gets his machine per insurance regs

## 2021-04-01 NOTE — Telephone Encounter (Signed)
Pt had his HST on 08/08 and is calling to these results.  CY please advise. Thanks

## 2021-04-02 NOTE — Telephone Encounter (Signed)
Called and spoke with Patient.  Dr. Roxy Cedar results and recommendations given.  Understanding stated.  Patient has OV scheduled 06/09/21, with Dr.Young.  DME order placed.  Nothing further at this time.

## 2021-06-02 ENCOUNTER — Telehealth: Payer: Self-pay | Admitting: Internal Medicine

## 2021-06-02 DIAGNOSIS — G4733 Obstructive sleep apnea (adult) (pediatric): Secondary | ICD-10-CM

## 2021-06-03 NOTE — Telephone Encounter (Signed)
CY please advise if the starting pressure on the CPAP can be increased.  The pt feels like he is smothering when using the cpap.  thanks

## 2021-06-03 NOTE — Telephone Encounter (Signed)
Please order to his DME- please change auto range to 7.5-20 cwp

## 2021-06-03 NOTE — Telephone Encounter (Signed)
I have called the pt and he stated that he uses [facility] on [address] in .  He is aware of order that has been sent over to get his pressure changed.  Nothing further is needed.

## 2021-06-04 ENCOUNTER — Telehealth: Payer: Self-pay | Admitting: Internal Medicine

## 2021-06-04 NOTE — Telephone Encounter (Signed)
Call made to patient, confirmed DOB. He states he got a new cpap machine but it is just a replacement. He states he does not mind keeping his appt however he does not want to have to come twice. He states this appt is for a f/u but they thought he would have a the cpap machine by now.   CY please advise patient is not a new start however he did repeat his sleep study in august and just got his cpap Friday. Does he need to come in next week or do we need to reschedule for at least 30 days out. Thanks :)

## 2021-06-04 NOTE — Telephone Encounter (Signed)
Please reschedule or 31-90 days after he got replacement machine

## 2021-06-04 NOTE — Telephone Encounter (Signed)
Call made to patient, appt made.   Nothing further needed at this time.

## 2021-06-09 ENCOUNTER — Telehealth: Payer: Self-pay | Admitting: Internal Medicine

## 2021-06-09 ENCOUNTER — Ambulatory Visit: Payer: Medicare PPO | Admitting: Internal Medicine

## 2021-06-09 DIAGNOSIS — G4733 Obstructive sleep apnea (adult) (pediatric): Secondary | ICD-10-CM

## 2021-06-09 NOTE — Telephone Encounter (Signed)
Please change CPAP pressure order to auto 5-20

## 2021-06-09 NOTE — Telephone Encounter (Signed)
CY please advise. thanks 

## 2021-06-10 NOTE — Telephone Encounter (Signed)
New order has been sent to [facility] to update the setting for the cpap machine.

## 2021-07-15 NOTE — Progress Notes (Signed)
02/05/21- 65 yoM former smoker for sleep evaluation courtesy of Carola Frost, NP with concern of OSA Medical problem list includes OSA, HTN, DM2,  CKD3, Gout Epworth score-) Body weight today- Covid vax-4 [employer]izer -----Patient has OSA and is needing a new sleep study and new cpap machine Sleep study was 18-20 years ago and machine is saying motor has reached end. He has no local DME.  Denies ENT surgery, heart or lung disease. He is satisfied that life is better with CPAP. Prior he snored loudly and couldn't stay awake. No sleep meds. 1-2 cups coffee.  07/16/21- 70 yoM former smoker followed for OSA, complicated by  HTN, DM2,  CKD3, Gout, HST 03/03/21- AHI 89.6/ hr, desaturation to 84%, body weight 294 lbs CPAP auto 5-20/ [facility]   ordered 04/02/21 Download-compliance 100%, AHI 1.5/ hr Body weight today-297 lbs Covid vax-5 [employer]izer Flu vax-had -----Patient states that he may need the pressure raised on his CPAP.  We reviewed download and discussed options.  We can let him try a little higher starting pressure.  Otherwise he is comfortable.  ROS-see HPI  + = positive Constitutional:    weight loss, night sweats, fevers, chills, fatigue, lassitude. HEENT:    headaches, difficulty swallowing, tooth/dental problems, sore throat,       sneezing, itching, ear ache, nasal congestion, post nasal drip, snoring CV:    chest pain, orthopnea, PND, swelling in lower extremities, anasarca,                                   dizziness, palpitations Resp:   shortness of breath with exertion or at rest.                productive cough,   non-productive cough, coughing up of blood.              change in color of mucus.  wheezing.   Skin:    rash or lesions. GI:  No-   heartburn, indigestion, abdominal pain, nausea, vomiting, diarrhea,                 change in bowel habits, loss of appetite GU: dysuria, change in color of urine, no urgency or frequency.   flank pain. MS:   joint pain, stiffness, decreased  range of motion, back pain. Neuro-     nothing unusual Psych:  change in mood or affect.  depression or anxiety.   memory loss.  OBJ- Physical Exam General- Alert, Oriented, Affect-appropriate, Distress- none acute, + obese Skin- rash-none, lesions- none, excoriation- none Lymphadenopathy- none Head- atraumatic            Eyes- ?disconjugate gaze            Ears- Hearing, canals-normal            Nose- Clear, no-Septal dev, mucus, polyps, erosion, perforation             Throat- Mallampati IV , mucosa clear , drainage- none, tonsils- atrophic, + teeth Neck- flexible , trachea midline, no stridor , thyroid nl, carotid no bruit Chest - symmetrical excursion , unlabored           Heart/CV- RRR , no murmur , no gallop  , no rub, nl s1 s2                           - JVD- none , edema-  none, stasis changes- none, varices- none           Lung- clear to P&A, wheeze- none, cough- none , dullness-none, rub- none           Chest wall-  Abd-  Br/ Gen/ Rectal- Not done, not indicated Extrem- cyanosis- none, clubbing, none, atrophy- none, strength- nl Neuro- grossly intact to observation

## 2021-07-16 ENCOUNTER — Ambulatory Visit: Payer: Medicare PPO | Admitting: Internal Medicine

## 2021-07-16 ENCOUNTER — Other Ambulatory Visit: Payer: Self-pay

## 2021-07-16 ENCOUNTER — Encounter: Payer: Self-pay | Admitting: Internal Medicine

## 2021-07-16 DIAGNOSIS — G4733 Obstructive sleep apnea (adult) (pediatric): Secondary | ICD-10-CM

## 2021-07-16 NOTE — Patient Instructions (Signed)
Order- DME [facility]- please change auto range to 8-20. Continue mask of choice, humidifier, supplies, AirView/ card

## 2021-07-16 NOTE — Assessment & Plan Note (Signed)
He benefits from CPAP with good compliance and control Plan-change AutoPap range to 8-20

## 2021-07-16 NOTE — Assessment & Plan Note (Signed)
Needs ongoing encouragement towards weight loss with diet and exercise.  Consider if he is a candidate for more active intervention such as bariatric surgery.

## 2021-07-23 ENCOUNTER — Other Ambulatory Visit: Payer: Self-pay | Admitting: *Deleted

## 2021-07-23 ENCOUNTER — Telehealth: Payer: Self-pay | Admitting: Internal Medicine

## 2021-07-23 DIAGNOSIS — G4733 Obstructive sleep apnea (adult) (pediatric): Secondary | ICD-10-CM

## 2021-07-23 NOTE — Telephone Encounter (Signed)
[facility] x1, left detailed message (DPR) regarding orders for change in CPAP pressure changes.  Left message that I was sending orders to DME company and to call with any questions.

## 2021-12-01 ENCOUNTER — Telehealth: Payer: Self-pay | Admitting: Internal Medicine

## 2021-12-01 DIAGNOSIS — G4733 Obstructive sleep apnea (adult) (pediatric): Secondary | ICD-10-CM

## 2021-12-01 NOTE — Telephone Encounter (Signed)
Patient is out of town all week and forgot CPAP supplies. Patient states there is a DME company there that has the supplies if an order can be placed.  ? ?[facility] ?Fax: [phone] ? ?Please call patient back with questions/ concerns or if order is placed at [phone]. ?

## 2021-12-02 NOTE — Telephone Encounter (Signed)
Spoke with the pt  ?He states he already received CPAP supplies from local DME  ?Nothing needed now  ?Will close encounter ?

## 2022-07-09 ENCOUNTER — Other Ambulatory Visit: Payer: Self-pay | Admitting: Orthopedic Surgery

## 2022-07-09 DIAGNOSIS — M25561 Pain in right knee: Secondary | ICD-10-CM

## 2022-07-16 ENCOUNTER — Ambulatory Visit: Payer: Medicare PPO | Admitting: Internal Medicine

## 2022-07-18 ENCOUNTER — Ambulatory Visit
Admission: RE | Admit: 2022-07-18 | Discharge: 2022-07-18 | Disposition: A | Discharge status: Home / Self Care | Payer: Medicare PPO | Source: Ambulatory Visit | Attending: Orthopedic Surgery | Admitting: Orthopedic Surgery

## 2022-07-18 DIAGNOSIS — M25561 Pain in right knee: Secondary | ICD-10-CM

## 2022-08-21 NOTE — Progress Notes (Unsigned)
HPI M former smoker followed for OSA, complicated by  HTN, DM2,  CKD3, Gout, HST 03/03/21- AHI 89.6/ hr, desaturation to 84%, body weight 294 lbs  ============================================================================= 07/16/21- 70 yoM former smoker followed for OSA, complicated by  HTN, DM2,  CKD3, Gout, HST 03/03/21- AHI 89.6/ hr, desaturation to 84%, body weight 294 lbs CPAP auto 5-20/ [facility]   ordered 04/02/21 Download-compliance 100%, AHI 1.5/ hr Body weight today-297 lbs Covid vax-5 [employer] Flu vax-had -----Patient states that he may need the pressure raised on his CPAP.  We reviewed download and discussed options.  We can let him try a little higher starting pressure.  Otherwise he is comfortable.  08/24/22- 71 yoM former smoker followed for OSA, complicated by  HTN, DM2,  CKD3, Gout, HST 03/03/21- AHI 89.6/ hr, desaturation to 84%, body weight 294 lbs CPAP auto 5-20/ [facility]   ordered 04/02/21 Download-compliance  Body weight today- Covid vax-5 [employer]izer Flu vax  ROS-see HPI  + = positive Constitutional:    weight loss, night sweats, fevers, chills, fatigue, lassitude. HEENT:    headaches, difficulty swallowing, tooth/dental problems, sore throat,       sneezing, itching, ear ache, nasal congestion, post nasal drip, snoring CV:    chest pain, orthopnea, PND, swelling in lower extremities, anasarca,                                   dizziness, palpitations Resp:   shortness of breath with exertion or at rest.                productive cough,   non-productive cough, coughing up of blood.              change in color of mucus.  wheezing.   Skin:    rash or lesions. GI:  No-   heartburn, indigestion, abdominal pain, nausea, vomiting, diarrhea,                 change in bowel habits, loss of appetite GU: dysuria, change in color of urine, no urgency or frequency.   flank pain. MS:   joint pain, stiffness, decreased range of motion, back pain. Neuro-     nothing unusual Psych:  change in  mood or affect.  depression or anxiety.   memory loss.  OBJ- Physical Exam General- Alert, Oriented, Affect-appropriate, Distress- none acute, + obese Skin- rash-none, lesions- none, excoriation- none Lymphadenopathy- none Head- atraumatic            Eyes- ?disconjugate gaze            Ears- Hearing, canals-normal            Nose- Clear, no-Septal dev, mucus, polyps, erosion, perforation             Throat- Mallampati IV , mucosa clear , drainage- none, tonsils- atrophic, + teeth Neck- flexible , trachea midline, no stridor , thyroid nl, carotid no bruit Chest - symmetrical excursion , unlabored           Heart/CV- RRR , no murmur , no gallop  , no rub, nl s1 s2                           - JVD- none , edema- none, stasis changes- none, varices- none           Lung- clear to P&A, wheeze- none, cough- none ,  dullness-none, rub- none           Chest wall-  Abd-  Br/ Gen/ Rectal- Not done, not indicated Extrem- cyanosis- none, clubbing, none, atrophy- none, strength- nl Neuro- grossly intact to observation

## 2022-08-24 ENCOUNTER — Ambulatory Visit: Payer: Medicare PPO | Admitting: Internal Medicine

## 2022-08-24 ENCOUNTER — Encounter: Payer: Self-pay | Admitting: Internal Medicine

## 2022-08-24 VITALS — BP 130/68 | HR 66 | Temp 97.9°F | Ht 71.0 in | Wt 292.8 lb

## 2022-08-24 DIAGNOSIS — G4733 Obstructive sleep apnea (adult) (pediatric): Secondary | ICD-10-CM

## 2022-08-24 NOTE — Assessment & Plan Note (Signed)
Benefits from CPAP. Watching [facility] Plan- continue auto 8-20

## 2022-08-24 NOTE — Patient Instructions (Signed)
We can continue CPAP auto 8-20  Please call if we can help 

## 2022-08-24 NOTE — Assessment & Plan Note (Signed)
Remains heavy Continue to work on diet/exercise

## 2022-09-28 NOTE — Progress Notes (Signed)
COVID Vaccine Completed: yes  Date of COVID positive in last 90 days:  PCP - Clyde Lundborg, PA Cardiologist - n/a  Chest x-ray - n/a EKG - 09/29/22 Epic/chart Stress Test - 15 years ago per pt ECHO - n/a Cardiac Cath - n/a Pacemaker/ICD device last checked: n/a Spinal Cord Stimulator: n/a  Bowel Prep - no  Sleep Study - yes CPAP -  yes every night  Fasting Blood Sugar - 95-115 Checks Blood Sugar 2 times a day  Last dose of GLP1 agonist-  N/A GLP1 instructions:  N/A   Last dose of SGLT-2 inhibitors-  N/A SGLT-2 instructions: N/A   Blood Thinner Instructions: n/a Aspirin Instructions: Last Dose:  Activity level: Can go up a flight of stairs and perform activities of daily living without stopping and without symptoms of chest pain. Sob with activity, does not have to stop per pt  Anesthesia review: scab to right shoulder from biopsy few weeks ago, creatinine 2.03, 1st degree AV block  Patient denies shortness of breath, fever, cough and chest pain at PAT appointment  Patient verbalized understanding of instructions that were given to them at the PAT appointment. Patient was also instructed that they will need to review over the PAT instructions again at home before surgery.

## 2022-09-28 NOTE — Patient Instructions (Signed)
SURGICAL WAITING ROOM VISITATION  Patients having surgery or a procedure may have no more than 2 support people in the waiting area - these visitors may rotate.    Children under the age of 82 must have an adult with them who is not the patient.  Due to an increase in RSV and influenza rates and associated hospitalizations, children ages 109 and under may not visit patients in [place].  If the patient needs to stay at the [facility] during part of their recovery, the visitor guidelines for inpatient rooms apply. Pre-op nurse will coordinate an appropriate time for 1 support person to accompany patient in pre-op.  This support person may not rotate.    Please refer to the [facility] website for the visitor guidelines for Inpatients (after your surgery is over and you are in a regular room).    Your procedure is scheduled on: 10/01/22   Report to [facility] Main Entrance    Report to admitting at 1:00 PM   Call this number if you have problems the morning of surgery [phone]   Do not eat food :After Midnight.   After Midnight you may have the following liquids until 12:30 AM DAY OF SURGERY  Water Non-Citrus Juices (without pulp, NO RED-Apple, White grape, White cranberry) Black Coffee (NO MILK/CREAM OR CREAMERS, sugar ok)  Clear Tea (NO MILK/CREAM OR CREAMERS, sugar ok) regular and decaf                             Plain Jell-O (NO RED)                                           Fruit ices (not with fruit pulp, NO RED)                                     Popsicles (NO RED)                                                               Sports drinks like Gatorade (NO RED    The day of surgery:  Drink ONE (1) Pre-Surgery G2 at 12:30 PM the morning of surgery. Drink in one sitting. Do not sip.  This drink was given to you during your [facility]  pre-op appointment visit. Nothing else to drink after completing the  Pre-Surgery G2.          If you have questions,  please contact your surgeon's office.   FOLLOW BOWEL PREP AND ANY ADDITIONAL PRE OP INSTRUCTIONS YOU RECEIVED FROM YOUR SURGEON'S OFFICE!!!     Oral Hygiene is also important to reduce your risk of infection.                                    Remember - BRUSH YOUR TEETH THE MORNING OF SURGERY WITH YOUR REGULAR TOOTHPASTE  DENTURES WILL BE REMOVED PRIOR TO SURGERY PLEASE DO NOT APPLY "Poly grip" OR ADHESIVES!!!  Take these medicines the morning of surgery with A SIP OF WATER: Tylenol, Allopurinol, Diltiazem, Hydralazine   DO NOT TAKE ANY ORAL DIABETIC MEDICATIONS DAY OF YOUR SURGERY  How to Manage Your Diabetes Before and After Surgery  Why is it important to control my blood sugar before and after surgery? Improving blood sugar levels before and after surgery helps healing and can limit problems. A way of improving blood sugar control is eating a healthy diet by:  Eating less sugar and carbohydrates  Increasing activity/exercise  Talking with your doctor about reaching your blood sugar goals High blood sugars (greater than 180 mg/dL) can raise your risk of infections and slow your recovery, so you will need to focus on controlling your diabetes during the weeks before surgery. Make sure that the doctor who takes care of your diabetes knows about your planned surgery including the date and location.  How do I manage my blood sugar before surgery? Check your blood sugar at least 4 times a day, starting 2 days before surgery, to make sure that the level is not too high or low. Check your blood sugar the morning of your surgery when you wake up and every 2 hours until you get to the Short Stay unit. If your blood sugar is less than 70 mg/dL, you will need to treat for low blood sugar: Do not take insulin. Treat a low blood sugar (less than 70 mg/dL) with  cup of clear juice (cranberry or apple), 4 glucose tablets, OR glucose gel. Recheck blood sugar in 15 minutes after treatment (to  make sure it is greater than 70 mg/dL). If your blood sugar is not greater than 70 mg/dL on recheck, call [phone] for further instructions. Report your blood sugar to the short stay nurse when you get to Short Stay.  If you are admitted to the [facility] after surgery: Your blood sugar will be checked by the staff and you will probably be given insulin after surgery (instead of oral diabetes medicines) to make sure you have good blood sugar levels. The goal for blood sugar control after surgery is 80-180 mg/dL.   WHAT DO I DO ABOUT MY DIABETES MEDICATION?  Do not take oral diabetes medicines (pills) the morning of surgery.  THE DAY BEFORE SURGERY, take 50% of insulin      THE MORNING OF SURGERY, do not take insulin  Reviewed and Endorsed by [facility] Patient Education Committee, August 2015  Bring CPAP mask and tubing day of surgery.                              You may not have any metal on your body including jewelry, and body piercing             Do not wear lotions, powders, cologne, or deodorant  Do not shave  48 hours prior to surgery.               Men may shave face and neck.   Do not bring valuables to the [facility]. [facility].   Contacts, glasses, dentures or bridgework may not be worn into surgery.  DO NOT [facility]. PHARMACY WILL DISPENSE MEDICATIONS LISTED ON YOUR MEDICATION LIST TO YOU DURING YOUR ADMISSION [facility]!    Patients discharged on the day  of surgery will not be allowed to drive home.  Someone NEEDS to stay with you for the first 24 hours after anesthesia.   Special Instructions: Bring a copy of your healthcare power of attorney and living will documents the day of surgery if you haven't scanned them before.              Please read over the following fact sheets you were given: IF [facility]   If you received a COVID test during your pre-op visit  it is requested that you wear a mask when out in public, stay away from anyone that may not be feeling well and notify your surgeon if you develop symptoms. If you test positive for Covid or have been in contact with anyone that has tested positive in the last 10 days please notify you surgeon.    [facility] - Preparing for Surgery Before surgery, you can play an important role.  Because skin is not sterile, your skin needs to be as free of germs as possible.  You can reduce the number of germs on your skin by washing with CHG (chlorahexidine gluconate) soap before surgery.  CHG is an antiseptic cleaner which kills germs and bonds with the skin to continue killing germs even after washing. Please DO NOT use if you have an allergy to CHG or antibacterial soaps.  If your skin becomes reddened/irritated stop using the CHG and inform your nurse when you arrive at Short Stay. Do not shave (including legs and underarms) for at least 48 hours prior to the first CHG shower.  You may shave your face/neck.  Please follow these instructions carefully:  1.  Shower with CHG Soap the night before surgery and the  morning of surgery.  2.  If you choose to wash your hair, wash your hair first as usual with your normal  shampoo.  3.  After you shampoo, rinse your hair and body thoroughly to remove the shampoo.                             4.  Use CHG as you would any other liquid soap.  You can apply chg directly to the skin and wash.  Gently with a scrungie or clean washcloth.  5.  Apply the CHG Soap to your body ONLY FROM THE NECK DOWN.   Do   not use on face/ open                           Wound or open sores. Avoid contact with eyes, ears mouth and   genitals (private parts).                       Wash face,  Genitals (private parts) with your normal soap.             6.  Wash thoroughly, paying special attention to the area where your    surgery   will be performed.  7.  Thoroughly rinse your body with warm water from the neck down.  8.  DO NOT shower/wash with your normal soap after using and rinsing off the CHG Soap.                9.  Pat yourself dry with a clean towel.  10.  Wear clean pajamas.            11.  Place clean sheets on your bed the night of your first shower and do not  sleep with pets. Day of Surgery : Do not apply any lotions/deodorants the morning of surgery.  Please wear clean clothes to the [facility]/surgery center.  FAILURE TO FOLLOW THESE INSTRUCTIONS MAY RESULT IN THE CANCELLATION OF YOUR SURGERY  PATIENT SIGNATURE_________________________________  NURSE SIGNATURE__________________________________  ________________________________________________________________________  [facility]  An incentive spirometer is a tool that can help keep your lungs clear and active. This tool measures how well you are filling your lungs with each breath. Taking long deep breaths may help reverse or decrease the chance of developing breathing (pulmonary) problems (especially infection) following: A long period of time when you are unable to move or be active. BEFORE THE PROCEDURE  If the spirometer includes an indicator to show your best effort, your nurse or respiratory therapist will set it to a desired goal. If possible, sit up straight or lean slightly forward. Try not to slouch. Hold the incentive spirometer in an upright position. INSTRUCTIONS FOR USE  Sit on the edge of your bed if possible, or sit up as far as you can in bed or on a chair. Hold the incentive spirometer in an upright position. Breathe out normally. Place the mouthpiece in your mouth and seal your lips tightly around it. Breathe in slowly and as deeply as possible, raising the piston or the ball toward the top of the column. Hold your breath for 3-5 seconds or for as long as possible. Allow the piston or ball to fall to the bottom  of the column. Remove the mouthpiece from your mouth and breathe out normally. Rest for a few seconds and repeat Steps 1 through 7 at least 10 times every 1-2 hours when you are awake. Take your time and take a few normal breaths between deep breaths. The spirometer may include an indicator to show your best effort. Use the indicator as a goal to work toward during each repetition. After each set of 10 deep breaths, practice coughing to be sure your lungs are clear. If you have an incision (the cut made at the time of surgery), support your incision when coughing by placing a pillow or rolled up towels firmly against it. Once you are able to get out of bed, walk around indoors and cough well. You may stop using the incentive spirometer when instructed by your caregiver.  RISKS AND COMPLICATIONS Take your time so you do not get dizzy or light-headed. If you are in pain, you may need to take or ask for pain medication before doing incentive spirometry. It is harder to take a deep breath if you are having pain. AFTER USE Rest and breathe slowly and easily. It can be helpful to keep track of a log of your progress. Your caregiver can provide you with a simple table to help with this. If you are using the spirometer at home, follow these instructions: [facility] IF:  You are having difficultly using the spirometer. You have trouble using the spirometer as often as instructed. Your pain medication is not giving enough relief while using the spirometer. You develop fever of 100.5 F (38.1 C) or higher. SEEK IMMEDIATE MEDICAL CARE IF:  You cough up bloody sputum that had not been present before. You develop fever of 102 F (38.9 C) or greater. You develop worsening pain at or near  the incision site. MAKE SURE YOU:  Understand these instructions. Will watch your condition. Will get help right away if you are not doing well or get worse. Document Released: 11/23/2006 Document Revised:  10/05/2011 Document Reviewed: 01/24/2007 ExitCare Patient Information 2014 [employer].   ________________________________________________________________________  [facility]- Preparing for Total Shoulder Arthroplasty    Before surgery, you can play an important role. Because skin is not sterile, your skin needs to be as free of germs as possible. You can reduce the number of germs on your skin by using the following products. Benzoyl Peroxide Gel Reduces the number of germs present on the skin Applied twice a day to shoulder area starting two days before surgery    ==================================================================  Please follow these instructions carefully:  BENZOYL PEROXIDE 5% GEL  Please do not use if you have an allergy to benzoyl peroxide.   If your skin becomes reddened/irritated stop using the benzoyl peroxide.  Starting two days before surgery, apply as follows: Apply benzoyl peroxide in the morning and at night. Apply after taking a shower. If you are not taking a shower clean entire shoulder front, back, and side along with the armpit with a clean wet washcloth.  Place a quarter-sized dollop on your shoulder and rub in thoroughly, making sure to cover the front, back, and side of your shoulder, along with the armpit.   2 days before ____ AM   ____ PM              1 day before ____ AM   ____ PM                         Do this twice a day for two days.  (Last application is the night before surgery, AFTER using the CHG soap as described below).  Do NOT apply benzoyl peroxide gel on the day of surgery.

## 2022-09-29 ENCOUNTER — Encounter (HOSPITAL_COMMUNITY): Payer: Self-pay

## 2022-09-29 ENCOUNTER — Encounter (HOSPITAL_COMMUNITY)
Admission: RE | Admit: 2022-09-29 | Discharge: 2022-09-29 | Disposition: A | Discharge status: Home / Self Care | Payer: Medicare PPO | Source: Ambulatory Visit | Attending: Orthopedic Surgery | Admitting: Orthopedic Surgery

## 2022-09-29 VITALS — BP 156/66 | HR 73 | Temp 98.6°F | Resp 16 | Ht 71.0 in | Wt 286.0 lb

## 2022-09-29 DIAGNOSIS — E119 Type 2 diabetes mellitus without complications: Secondary | ICD-10-CM

## 2022-09-29 DIAGNOSIS — Z87891 Personal history of nicotine dependence: Secondary | ICD-10-CM | POA: Insufficient documentation

## 2022-09-29 DIAGNOSIS — E1122 Type 2 diabetes mellitus with diabetic chronic kidney disease: Secondary | ICD-10-CM | POA: Insufficient documentation

## 2022-09-29 DIAGNOSIS — N1832 Chronic kidney disease, stage 3b: Secondary | ICD-10-CM | POA: Insufficient documentation

## 2022-09-29 DIAGNOSIS — Z01818 Encounter for other preprocedural examination: Secondary | ICD-10-CM | POA: Diagnosis not present

## 2022-09-29 DIAGNOSIS — M75101 Unspecified rotator cuff tear or rupture of right shoulder, not specified as traumatic: Secondary | ICD-10-CM | POA: Diagnosis not present

## 2022-09-29 DIAGNOSIS — Z794 Long term (current) use of insulin: Secondary | ICD-10-CM | POA: Diagnosis not present

## 2022-09-29 DIAGNOSIS — I129 Hypertensive chronic kidney disease with stage 1 through stage 4 chronic kidney disease, or unspecified chronic kidney disease: Secondary | ICD-10-CM | POA: Diagnosis not present

## 2022-09-29 DIAGNOSIS — G4733 Obstructive sleep apnea (adult) (pediatric): Secondary | ICD-10-CM | POA: Diagnosis not present

## 2022-09-29 HISTORY — DX: Chronic kidney disease, unspecified: N18.9

## 2022-09-29 LAB — SURGICAL PCR SCREEN
MRSA, PCR: NEGATIVE
Staphylococcus aureus: POSITIVE — AB

## 2022-09-29 LAB — CBC
HCT: 35.6 % — ABNORMAL LOW (ref 39.0–52.0)
Hemoglobin: 11.7 g/dL — ABNORMAL LOW (ref 13.0–17.0)
MCH: 32.1 pg (ref 26.0–34.0)
MCHC: 32.9 g/dL (ref 30.0–36.0)
MCV: 97.5 fL (ref 80.0–100.0)
Platelets: 415 10*3/uL — ABNORMAL HIGH (ref 150–400)
RBC: 3.65 MIL/uL — ABNORMAL LOW (ref 4.22–5.81)
RDW: 14.8 % (ref 11.5–15.5)
WBC: 12.2 10*3/uL — ABNORMAL HIGH (ref 4.0–10.5)
nRBC: 0 % (ref 0.0–0.2)

## 2022-09-29 LAB — GLUCOSE, CAPILLARY: Glucose-Capillary: 184 mg/dL — ABNORMAL HIGH (ref 70–99)

## 2022-09-29 LAB — BASIC METABOLIC PANEL
Anion gap: 10 (ref 5–15)
BUN: 65 mg/dL — ABNORMAL HIGH (ref 8–23)
CO2: 18 mmol/L — ABNORMAL LOW (ref 22–32)
Calcium: 9.5 mg/dL (ref 8.9–10.3)
Chloride: 107 mmol/L (ref 98–111)
Creatinine, Ser: 2.03 mg/dL — ABNORMAL HIGH (ref 0.61–1.24)
GFR, Estimated: 35 mL/min — ABNORMAL LOW (ref >60)
Glucose, Bld: 171 mg/dL — ABNORMAL HIGH (ref 70–99)
Potassium: 4.3 mmol/L (ref 3.5–5.1)
Sodium: 135 mmol/L (ref 135–145)

## 2022-09-29 NOTE — Progress Notes (Signed)
Creatinine 2.03 and STAPH+ results routed to Dr. Onnie Graham

## 2022-09-30 ENCOUNTER — Encounter (HOSPITAL_COMMUNITY): Payer: Self-pay | Admitting: Anesthesiology

## 2022-09-30 ENCOUNTER — Encounter (HOSPITAL_COMMUNITY): Payer: Self-pay | Admitting: Physician Assistant

## 2022-09-30 LAB — HEMOGLOBIN A1C
Hgb A1c MFr Bld: 6.3 % — ABNORMAL HIGH (ref 4.8–5.6)
Mean Plasma Glucose: 134 mg/dL

## 2022-09-30 NOTE — Progress Notes (Signed)
Anesthesia Chart Review   Case: [id number] Date/Time: 10/01/22 1515   Procedure: REVERSE SHOULDER ARTHROPLASTY (Right: Shoulder) - 142mn   Anesthesia type: General   Pre-op diagnosis: Right shoulder rotator cuff tear arthropathy   Location: [facility] ROOM 07 / [facility]   Surgeons: SJustice Britain MD       DISCUSSION:71 y.o. former smoker with h/o HTN, DM II (A1C 6.3), CKD Stage IIIb/early stage IV, OSA on CPAP, right shoulder rotator cuff tear scheduled for above procedure 10/01/2022 with Dr. KJustice Britain   Pt last seen by nephrology 07/29/2022. Per notes baseline creatinine ranges from 1.7-2.0.   Pt last seen by PCP 07/24/2022.   Anticipate pt can proceed with planned procedure barring acute status change.   VS: BP (!) 156/66   Pulse 73   Temp 37 C (Oral)   Resp 16   Ht '5\' 11"'$  (1.803 m)   Wt 129.7 kg   SpO2 97%   BMI 39.89 kg/m   PROVIDERS: WHeywood Bene PA-C is PCP   LThereasa Distance MD is nephrologist  LABS: Labs reviewed: Acceptable for surgery. (all labs ordered are listed, but only abnormal results are displayed)  Labs Reviewed  SURGICAL PCR SCREEN - Abnormal; Notable for the following components:      Result Value   Staphylococcus aureus POSITIVE (*)    All other components within normal limits  HEMOGLOBIN A1C - Abnormal; Notable for the following components:   Hgb A1c MFr Bld 6.3 (*)    All other components within normal limits  BASIC METABOLIC PANEL - Abnormal; Notable for the following components:   CO2 18 (*)    Glucose, Bld 171 (*)    BUN 65 (*)    Creatinine, Ser 2.03 (*)    GFR, Estimated 35 (*)    All other components within normal limits  CBC - Abnormal; Notable for the following components:   WBC 12.2 (*)    RBC 3.65 (*)    Hemoglobin 11.7 (*)    HCT 35.6 (*)    Platelets 415 (*)    All other components within normal limits  GLUCOSE, CAPILLARY - Abnormal; Notable for the following components:   Glucose-Capillary 184 (*)    All other  components within normal limits     IMAGES:   EKG:   CV:  Past Medical History:  Diagnosis Date   Arthritis    Chronic kidney disease    Diabetes mellitus without complication (HCC)    Gout    High cholesterol    Hypertension     Past Surgical History:  Procedure Laterality Date   APPENDECTOMY  03/28/1967   CATARACT EXTRACTION Bilateral    COLONOSCOPY      MEDICATIONS:  acetaminophen (TYLENOL) 500 MG tablet   allopurinol (ZYLOPRIM) 300 MG tablet   atorvastatin (LIPITOR) 40 MG tablet   diltiazem (CARDIZEM CD) 360 MG 24 hr capsule   fluticasone (FLONASE) 50 MCG/ACT nasal spray   furosemide (LASIX) 20 MG tablet   hydrALAZINE (APRESOLINE) 25 MG tablet   sodium bicarbonate 325 MG tablet   spironolactone (ALDACTONE) 25 MG tablet   TOUJEO SOLOSTAR 300 UNIT/ML Solostar Pen   TURMERIC CURCUMIN PO   Vitamin D, Ergocalciferol, (DRISDOL) 1.25 MG (50000 UNIT) CAPS capsule   No current facility-administered medications for this encounter.    JKonrad FelixWard, PA-C [facility]Surgical [facility] [phone]

## 2022-10-01 ENCOUNTER — Encounter (HOSPITAL_COMMUNITY): Admission: RE | Payer: Self-pay | Source: Home / Self Care

## 2022-10-01 ENCOUNTER — Ambulatory Visit (HOSPITAL_COMMUNITY): Admission: RE | Admit: 2022-10-01 | Payer: Medicare PPO | Source: Home / Self Care | Admitting: Orthopedic Surgery

## 2022-10-01 DIAGNOSIS — Z01818 Encounter for other preprocedural examination: Secondary | ICD-10-CM

## 2022-10-01 DIAGNOSIS — E119 Type 2 diabetes mellitus without complications: Secondary | ICD-10-CM

## 2022-10-01 SURGERY — ARTHROPLASTY, SHOULDER, TOTAL, REVERSE
Anesthesia: General | Site: Shoulder | Laterality: Right

## 2022-10-01 NOTE — Progress Notes (Signed)
Patients procedure is cancelled today per Dr Onnie Graham for an emergency.  Patient notified over the phone and verbalized understanding.  Patient was instructed to call Dr Augustin Coupe office tomorrow for a plan of care update.    Jasmine Pang BSN, Academic librarian - [facility] [place] [phone]

## 2022-10-02 ENCOUNTER — Ambulatory Visit (HOSPITAL_COMMUNITY): Payer: Medicare PPO | Admitting: Anesthesiology

## 2022-10-02 ENCOUNTER — Encounter (HOSPITAL_COMMUNITY)
Admission: RE | Disposition: A | Discharge status: Home / Self Care | Payer: Self-pay | Source: Ambulatory Visit | Attending: Orthopedic Surgery

## 2022-10-02 ENCOUNTER — Ambulatory Visit (HOSPITAL_COMMUNITY)
Admission: RE | Admit: 2022-10-02 | Discharge: 2022-10-02 | Disposition: A | Discharge status: Home / Self Care | Payer: Medicare PPO | Source: Ambulatory Visit | Attending: Orthopedic Surgery | Admitting: Orthopedic Surgery

## 2022-10-02 ENCOUNTER — Other Ambulatory Visit: Payer: Self-pay

## 2022-10-02 ENCOUNTER — Ambulatory Visit (HOSPITAL_BASED_OUTPATIENT_CLINIC_OR_DEPARTMENT_OTHER): Payer: Medicare PPO | Admitting: Anesthesiology

## 2022-10-02 ENCOUNTER — Encounter (HOSPITAL_COMMUNITY): Payer: Self-pay | Admitting: Orthopedic Surgery

## 2022-10-02 DIAGNOSIS — M75101 Unspecified rotator cuff tear or rupture of right shoulder, not specified as traumatic: Secondary | ICD-10-CM | POA: Insufficient documentation

## 2022-10-02 DIAGNOSIS — Z87891 Personal history of nicotine dependence: Secondary | ICD-10-CM | POA: Diagnosis not present

## 2022-10-02 DIAGNOSIS — I129 Hypertensive chronic kidney disease with stage 1 through stage 4 chronic kidney disease, or unspecified chronic kidney disease: Secondary | ICD-10-CM | POA: Insufficient documentation

## 2022-10-02 DIAGNOSIS — I10 Essential (primary) hypertension: Secondary | ICD-10-CM

## 2022-10-02 DIAGNOSIS — E119 Type 2 diabetes mellitus without complications: Secondary | ICD-10-CM

## 2022-10-02 DIAGNOSIS — M12811 Other specific arthropathies, not elsewhere classified, right shoulder: Secondary | ICD-10-CM | POA: Diagnosis not present

## 2022-10-02 DIAGNOSIS — Z09 Encounter for follow-up examination after completed treatment for conditions other than malignant neoplasm: Secondary | ICD-10-CM | POA: Insufficient documentation

## 2022-10-02 DIAGNOSIS — N189 Chronic kidney disease, unspecified: Secondary | ICD-10-CM | POA: Diagnosis not present

## 2022-10-02 DIAGNOSIS — Z794 Long term (current) use of insulin: Secondary | ICD-10-CM

## 2022-10-02 DIAGNOSIS — E1122 Type 2 diabetes mellitus with diabetic chronic kidney disease: Secondary | ICD-10-CM | POA: Insufficient documentation

## 2022-10-02 HISTORY — PX: REVERSE SHOULDER ARTHROPLASTY: SHX5054

## 2022-10-02 LAB — GLUCOSE, CAPILLARY
Glucose-Capillary: 133 mg/dL — ABNORMAL HIGH (ref 70–99)
Glucose-Capillary: 122 mg/dL — ABNORMAL HIGH (ref 70–99)

## 2022-10-02 SURGERY — ARTHROPLASTY, SHOULDER, TOTAL, REVERSE
Anesthesia: General | Site: Shoulder | Laterality: Right

## 2022-10-02 MED ORDER — PHENYLEPHRINE HCL (PRESSORS) 10 MG/ML IV SOLN
INTRAVENOUS | Status: AC
  Filled 2022-10-02: qty 1

## 2022-10-02 MED ORDER — STERILE WATER FOR IRRIGATION IR SOLN
Status: DC | PRN
  Administered 2022-10-02: 2000 mL

## 2022-10-02 MED ORDER — TRANEXAMIC ACID-NACL 1000-0.7 MG/100ML-% IV SOLN
1000.0000 mg | INTRAVENOUS | Status: AC
  Administered 2022-10-02: 1000 mg via INTRAVENOUS
  Filled 2022-10-02: qty 100

## 2022-10-02 MED ORDER — CYCLOBENZAPRINE HCL 10 MG PO TABS: 10.0000 mg | ORAL_TABLET | Freq: Three times a day (TID) | ORAL | 1 refills | Status: DC | PRN

## 2022-10-02 MED ORDER — ORAL CARE MOUTH RINSE: 15.0000 mL | Freq: Once | OROMUCOSAL | Status: AC

## 2022-10-02 MED ORDER — FENTANYL CITRATE PF 50 MCG/ML IJ SOSY
PREFILLED_SYRINGE | INTRAMUSCULAR | Status: AC
  Administered 2022-10-02: 50 ug via INTRAVENOUS
  Filled 2022-10-02: qty 2

## 2022-10-02 MED ORDER — VANCOMYCIN HCL 1000 MG IV SOLR
INTRAVENOUS | Status: AC
  Filled 2022-10-02: qty 20

## 2022-10-02 MED ORDER — ONDANSETRON HCL 4 MG/2ML IJ SOLN: 4.0000 mg | Freq: Once | INTRAMUSCULAR | Status: DC | PRN

## 2022-10-02 MED ORDER — TRANEXAMIC ACID 1000 MG/10ML IV SOLN: 1000.0000 mg | INTRAVENOUS | Status: DC

## 2022-10-02 MED ORDER — METOCLOPRAMIDE HCL 5 MG PO TABS: 5.0000 mg | ORAL_TABLET | Freq: Three times a day (TID) | ORAL | Status: DC | PRN

## 2022-10-02 MED ORDER — FENTANYL CITRATE PF 50 MCG/ML IJ SOSY
50.0000 ug | PREFILLED_SYRINGE | INTRAMUSCULAR | Status: DC
  Administered 2022-10-02: 100 ug via INTRAVENOUS
  Filled 2022-10-02: qty 2

## 2022-10-02 MED ORDER — BUPIVACAINE LIPOSOME 1.3 % IJ SUSP
INTRAMUSCULAR | Status: DC | PRN
  Administered 2022-10-02: 10 mL via PERINEURAL

## 2022-10-02 MED ORDER — CHLORHEXIDINE GLUCONATE 0.12 % MT SOLN
15.0000 mL | Freq: Once | OROMUCOSAL | Status: AC
  Administered 2022-10-02: 15 mL via OROMUCOSAL

## 2022-10-02 MED ORDER — ROCURONIUM BROMIDE 10 MG/ML (PF) SYRINGE
PREFILLED_SYRINGE | INTRAVENOUS | Status: DC | PRN
  Administered 2022-10-02: 100 mg via INTRAVENOUS

## 2022-10-02 MED ORDER — OXYCODONE HCL 5 MG/5ML PO SOLN: 5.0000 mg | Freq: Once | ORAL | Status: AC | PRN

## 2022-10-02 MED ORDER — ONDANSETRON HCL 4 MG/2ML IJ SOLN
INTRAMUSCULAR | Status: DC | PRN
  Administered 2022-10-02: 4 mg via INTRAVENOUS

## 2022-10-02 MED ORDER — CEFAZOLIN IN SODIUM CHLORIDE 3-0.9 GM/100ML-% IV SOLN
3.0000 g | INTRAVENOUS | Status: AC
  Administered 2022-10-02: 3 g via INTRAVENOUS
  Filled 2022-10-02: qty 100

## 2022-10-02 MED ORDER — CEFAZOLIN IN SODIUM CHLORIDE 3-0.9 GM/100ML-% IV SOLN
INTRAVENOUS | Status: AC
  Filled 2022-10-02: qty 100

## 2022-10-02 MED ORDER — OXYCODONE HCL 5 MG PO TABS
5.0000 mg | ORAL_TABLET | Freq: Once | ORAL | Status: AC | PRN
  Administered 2022-10-02: 5 mg via ORAL

## 2022-10-02 MED ORDER — DEXAMETHASONE SODIUM PHOSPHATE 10 MG/ML IJ SOLN
INTRAMUSCULAR | Status: DC | PRN
  Administered 2022-10-02: 4 mg via INTRAVENOUS

## 2022-10-02 MED ORDER — MIDAZOLAM HCL 2 MG/2ML IJ SOLN
1.0000 mg | INTRAMUSCULAR | Status: DC
  Administered 2022-10-02: 2 mg via INTRAVENOUS
  Filled 2022-10-02: qty 2

## 2022-10-02 MED ORDER — VANCOMYCIN HCL 1000 MG IV SOLR
INTRAVENOUS | Status: DC | PRN
  Administered 2022-10-02: 1000 mg

## 2022-10-02 MED ORDER — FENTANYL CITRATE PF 50 MCG/ML IJ SOSY
25.0000 ug | PREFILLED_SYRINGE | INTRAMUSCULAR | Status: DC | PRN
  Administered 2022-10-02: 50 ug via INTRAVENOUS

## 2022-10-02 MED ORDER — OXYCODONE-ACETAMINOPHEN 5-325 MG PO TABS: 1.0000 | ORAL_TABLET | ORAL | 0 refills | Status: DC | PRN

## 2022-10-02 MED ORDER — PROPOFOL 10 MG/ML IV BOLUS
INTRAVENOUS | Status: DC | PRN
  Administered 2022-10-02: 200 mg via INTRAVENOUS

## 2022-10-02 MED ORDER — METOCLOPRAMIDE HCL 5 MG/ML IJ SOLN: 5.0000 mg | Freq: Three times a day (TID) | INTRAMUSCULAR | Status: DC | PRN

## 2022-10-02 MED ORDER — OXYCODONE HCL 5 MG PO TABS
ORAL_TABLET | ORAL | Status: AC
  Filled 2022-10-02: qty 1

## 2022-10-02 MED ORDER — SUGAMMADEX SODIUM 500 MG/5ML IV SOLN
INTRAVENOUS | Status: DC | PRN
  Administered 2022-10-02: 400 mg via INTRAVENOUS

## 2022-10-02 MED ORDER — BUPIVACAINE HCL (PF) 0.5 % IJ SOLN
INTRAMUSCULAR | Status: DC | PRN
  Administered 2022-10-02: 15 mL via PERINEURAL

## 2022-10-02 MED ORDER — ONDANSETRON HCL 4 MG/2ML IJ SOLN: 4.0000 mg | Freq: Four times a day (QID) | INTRAMUSCULAR | Status: DC | PRN

## 2022-10-02 MED ORDER — PHENYLEPHRINE 80 MCG/ML (10ML) SYRINGE FOR IV PUSH (FOR BLOOD PRESSURE SUPPORT): PREFILLED_SYRINGE | INTRAVENOUS | Status: DC | PRN

## 2022-10-02 MED ORDER — MEPERIDINE HCL 50 MG/ML IJ SOLN: 6.2500 mg | INTRAMUSCULAR | Status: DC | PRN

## 2022-10-02 MED ORDER — PHENYLEPHRINE HCL-NACL 20-0.9 MG/250ML-% IV SOLN
INTRAVENOUS | Status: DC | PRN
  Administered 2022-10-02: 40 ug/min via INTRAVENOUS

## 2022-10-02 MED ORDER — 0.9 % SODIUM CHLORIDE (POUR BTL) OPTIME
TOPICAL | Status: DC | PRN
  Administered 2022-10-02: 1000 mL

## 2022-10-02 MED ORDER — LACTATED RINGERS IV SOLN: INTRAVENOUS | Status: DC

## 2022-10-02 MED ORDER — LIDOCAINE 2% (20 MG/ML) 5 ML SYRINGE
INTRAMUSCULAR | Status: DC | PRN
  Administered 2022-10-02: 100 mg via INTRAVENOUS

## 2022-10-02 MED ORDER — ACETAMINOPHEN 160 MG/5ML PO SOLN: 325.0000 mg | ORAL | Status: DC | PRN

## 2022-10-02 MED ORDER — ONDANSETRON HCL 4 MG PO TABS: 4.0000 mg | ORAL_TABLET | Freq: Four times a day (QID) | ORAL | Status: DC | PRN

## 2022-10-02 MED ORDER — PROPOFOL 10 MG/ML IV BOLUS
INTRAVENOUS | Status: AC
  Filled 2022-10-02: qty 20

## 2022-10-02 MED ORDER — PHENYLEPHRINE HCL (PRESSORS) 10 MG/ML IV SOLN
INTRAVENOUS | Status: DC | PRN
  Administered 2022-10-02 (×2): 100 ug via INTRAVENOUS

## 2022-10-02 MED ORDER — ONDANSETRON HCL 4 MG PO TABS: 4.0000 mg | ORAL_TABLET | Freq: Three times a day (TID) | ORAL | 0 refills | Status: DC | PRN

## 2022-10-02 MED ORDER — ACETAMINOPHEN 325 MG PO TABS: 325.0000 mg | ORAL_TABLET | ORAL | Status: DC | PRN

## 2022-10-02 SURGICAL SUPPLY — 75 items
ADH SKN CLS APL DERMABOND .7 (GAUZE/BANDAGES/DRESSINGS) ×1
AID PSTN UNV HD RSTRNT DISP (MISCELLANEOUS) ×1
BAG COUNTER SPONGE SURGICOUNT (BAG) IMPLANT
BAG SPEC THK2 15X12 ZIP CLS (MISCELLANEOUS) ×1
BAG SPNG CNTER NS LX DISP (BAG)
BAG ZIPLOCK 12X15 (MISCELLANEOUS) ×1 IMPLANT
BIT DRILL AR 3 (BIT) ×1
BIT DRILL AR 3 NS (BIT) IMPLANT
BLADE SAW SGTL 83.5X18.5 (BLADE) ×1 IMPLANT
BNDG CMPR 5X4 CHSV STRCH STRL (GAUZE/BANDAGES/DRESSINGS) ×1
BNDG COHESIVE 4X5 TAN STRL LF (GAUZE/BANDAGES/DRESSINGS) ×1 IMPLANT
BSPLAT GLND +2X24 MDLR (Joint) ×1 IMPLANT
CEMENT BONE DEPUY (Cement) IMPLANT
COOLER ICEMAN CLASSIC (MISCELLANEOUS) ×1 IMPLANT
COVER BACK TABLE 60X90IN (DRAPES) ×1 IMPLANT
COVER SURGICAL LIGHT HANDLE (MISCELLANEOUS) ×1 IMPLANT
CUP SUT UNIV REVERS 39 NEU (Shoulder) IMPLANT
DERMABOND ADVANCED .7 DNX12 (GAUZE/BANDAGES/DRESSINGS) ×1 IMPLANT
DRAPE ORTHO SPLIT 77X108 STRL (DRAPES) ×2
DRAPE SHEET LG 3/4 BI-LAMINATE (DRAPES) ×1 IMPLANT
DRAPE SURG 17X11 SM STRL (DRAPES) ×1 IMPLANT
DRAPE SURG ORHT 6 SPLT 77X108 (DRAPES) ×2 IMPLANT
DRAPE TOP 10253 STERILE (DRAPES) ×1 IMPLANT
DRAPE U-SHAPE 47X51 STRL (DRAPES) ×1 IMPLANT
DRESSING AQUACEL AG SP 3.5X10 (GAUZE/BANDAGES/DRESSINGS) IMPLANT
DRESSING AQUACEL AG SP 3.5X6 (GAUZE/BANDAGES/DRESSINGS) ×1 IMPLANT
DRILL SURG AR 10 (DRILL) IMPLANT
DRSG AQUACEL AG ADV 3.5X10 (GAUZE/BANDAGES/DRESSINGS) IMPLANT
DRSG AQUACEL AG SP 3.5X10 (GAUZE/BANDAGES/DRESSINGS) ×1
DRSG AQUACEL AG SP 3.5X6 (GAUZE/BANDAGES/DRESSINGS) ×1
DURAPREP 26ML APPLICATOR (WOUND CARE) ×1 IMPLANT
ELECT BLADE TIP CTD 4 INCH (ELECTRODE) ×1 IMPLANT
ELECT PENCIL ROCKER SW 15FT (MISCELLANEOUS) ×1 IMPLANT
ELECT REM PT RETURN 15FT ADLT (MISCELLANEOUS) ×1 IMPLANT
FACESHIELD WRAPAROUND (MASK) ×5 IMPLANT
FACESHIELD WRAPAROUND OR TEAM (MASK) ×5 IMPLANT
GLENOID UNI REV MOD 24 +2 LAT (Joint) IMPLANT
GLENOSPHERE 39+4 LAT/24 UNI RV (Joint) IMPLANT
GLOVE BIO SURGEON STRL SZ7.5 (GLOVE) ×1 IMPLANT
GLOVE BIO SURGEON STRL SZ8 (GLOVE) ×1 IMPLANT
GLOVE SS BIOGEL STRL SZ 7 (GLOVE) ×1 IMPLANT
GLOVE SS BIOGEL STRL SZ 7.5 (GLOVE) ×1 IMPLANT
GOWN STRL SURGICAL XL XLNG (GOWN DISPOSABLE) ×2 IMPLANT
INSERT HUMERAL MED 39/ +3 (Shoulder) IMPLANT
INSERT MEDIUM HUMERAL 39/ +3 (Shoulder) ×1 IMPLANT
KIT BASIN OR (CUSTOM PROCEDURE TRAY) ×1 IMPLANT
KIT TURNOVER KIT A (KITS) IMPLANT
MANIFOLD NEPTUNE II (INSTRUMENTS) ×1 IMPLANT
NDL TAPERED W/ NITINOL LOOP (MISCELLANEOUS) ×1 IMPLANT
NEEDLE TAPERED W/ NITINOL LOOP (MISCELLANEOUS) ×1 IMPLANT
NS IRRIG 1000ML POUR BTL (IV SOLUTION) ×1 IMPLANT
PACK SHOULDER (CUSTOM PROCEDURE TRAY) ×1 IMPLANT
PAD ARMBOARD 7.5X6 YLW CONV (MISCELLANEOUS) ×1 IMPLANT
PAD COLD SHLDR WRAP-ON (PAD) ×1 IMPLANT
PIN NITINOL TARGETER 2.8 (PIN) IMPLANT
PIN SET MODULAR GLENOID SYSTEM (PIN) IMPLANT
RESTRAINT HEAD UNIVERSAL NS (MISCELLANEOUS) ×1 IMPLANT
SCREW CENTRAL MOD 35 (Screw) IMPLANT
SCREW PERIPHERAL 5.5X20 LOCK (Screw) IMPLANT
SCREW PERIPHERAL 5.5X28 LOCK (Screw) IMPLANT
SCREW PERIPHERAL 5.5X40 LOCK (Screw) IMPLANT
SLING ARM FOAM STRAP LRG (SOFTGOODS) IMPLANT
SLING ARM FOAM STRAP MED (SOFTGOODS) IMPLANT
SPACER SHLD UNI REV 39 +6 (Shoulder) IMPLANT
STEM HUM UNIV REV SZ11 (Stem) IMPLANT
SUT MNCRL AB 3-0 PS2 18 (SUTURE) ×1 IMPLANT
SUT MON AB 2-0 CT1 36 (SUTURE) ×1 IMPLANT
SUT VIC AB 1 CT1 36 (SUTURE) ×1 IMPLANT
SUTURE TAPE 1.3 40 TPR END (SUTURE) ×2 IMPLANT
SUTURETAPE 1.3 40 TPR END (SUTURE) ×2
TOWEL OR 17X26 10 PK STRL BLUE (TOWEL DISPOSABLE) ×1 IMPLANT
TOWEL OR NON WOVEN STRL DISP B (DISPOSABLE) ×1 IMPLANT
TUBE SUCTION HIGH CAP CLEAR NV (SUCTIONS) ×1 IMPLANT
TUBING CONNECTING 10 (TUBING) IMPLANT
WATER STERILE IRR 1000ML POUR (IV SOLUTION) ×2 IMPLANT

## 2022-10-02 NOTE — Anesthesia Procedure Notes (Signed)
Procedure Name: Intubation Date/Time: 10/02/2022 2:45 PM  Performed by: Garrel Ridgel, CRNAPre-anesthesia Checklist: Patient identified, Emergency Drugs available, Suction available and Patient being monitored Patient Re-evaluated:Patient Re-evaluated prior to induction Oxygen Delivery Method: Circle system utilized Preoxygenation: Pre-oxygenation with 100% oxygen Induction Type: IV induction Ventilation: Mask ventilation without difficulty Laryngoscope Size: Glidescope and 4 Grade View: Grade II Tube type: Oral Tube size: 7.5 mm Number of attempts: 1 Airway Equipment and Method: Stylet Placement Confirmation: ETT inserted through vocal cords under direct vision, positive ETCO2 and breath sounds checked- equal and bilateral Secured at: 23 cm Tube secured with: Tape Dental Injury: Teeth and Oropharynx as per pre-operative assessment  Difficulty Due To: Difficulty was anticipated

## 2022-10-02 NOTE — Anesthesia Procedure Notes (Signed)
Date/Time: 10/02/2022 4:30 PM  Performed by: Cynda Familia, CRNAOxygen Delivery Method: Simple face mask Placement Confirmation: positive ETCO2 and breath sounds checked- equal and bilateral Dental Injury: Teeth and Oropharynx as per pre-operative assessment

## 2022-10-02 NOTE — Discharge Instructions (Signed)

## 2022-10-02 NOTE — Progress Notes (Signed)
Went over discharge instructions, iceman machine, dressing, and ROM exercises at length. Incentive spirometer given with teaching completed by this RN, pt and wife voiced understanding of all instructions, all questions answered.   Per charge RN, Christa, Jenetta Loges, PA aware that no OT was available this late and was okay with pt not having OT before going home. ROM exercises and iceman machine discussed at length with pt and wife.

## 2022-10-02 NOTE — Anesthesia Procedure Notes (Signed)
Anesthesia Regional Block: Interscalene brachial plexus block   Pre-Anesthetic Checklist: , timeout performed,  Correct Patient, Correct Site, Correct Laterality,  Correct Procedure, Correct Position, site marked,  Risks and benefits discussed,  Surgical consent,  Pre-op evaluation,  At surgeon's request and post-op pain management  Laterality: Right  Prep: chloraprep       Needles:  Injection technique: Single-shot  Needle Type: Echogenic Stimulator Needle     Needle Length: 5cm  Needle Gauge: 22     Additional Needles:   Procedures:, nerve stimulator,,, ultrasound used (permanent image in chart),,     Nerve Stimulator or Paresthesia:  Response: hand, 0.45 mA  Additional Responses:   Narrative:  Start time: 10/02/2022 2:15 PM End time: 10/02/2022 2:20 PM Injection made incrementally with aspirations every 5 mL.  Performed by: Personally  Anesthesiologist: Janeece Riggers, MD  Additional Notes: Functioning IV was confirmed and monitors were applied.  A 57m 22ga Arrow echogenic stimulator needle was used. Sterile prep and drape,hand hygiene and sterile gloves were used. Ultrasound guidance: relevant anatomy identified, needle position confirmed, local anesthetic spread visualized around nerve(s)., vascular puncture avoided.  Image printed for medical record. Negative aspiration and negative test dose prior to incremental administration of local anesthetic. The patient tolerated the procedure well.

## 2022-10-02 NOTE — Anesthesia Preprocedure Evaluation (Addendum)
Anesthesia Evaluation  Patient identified by MRN, date of birth, ID band Patient awake    Reviewed: Allergy & Precautions, H&P , NPO status , Patient's Chart, lab work & pertinent test results  Airway Mallampati: II  TM Distance: >3 FB Neck ROM: Full    Dental no notable dental hx. (+) Teeth Intact, Dental Advisory Given   Pulmonary neg pulmonary ROS, sleep apnea , former smoker   Pulmonary exam normal breath sounds clear to auscultation       Cardiovascular Exercise Tolerance: Good hypertension, Pt. on medications negative cardio ROS Normal cardiovascular exam Rhythm:Regular Rate:Normal     Neuro/Psych negative neurological ROS  negative psych ROS   GI/Hepatic negative GI ROS, Neg liver ROS,,,  Endo/Other  negative endocrine ROSdiabetes  Morbid obesity  Renal/GU negative Renal ROS  negative genitourinary   Musculoskeletal negative musculoskeletal ROS (+) Arthritis ,    Abdominal   Peds negative pediatric ROS (+)  Hematology negative hematology ROS (+)   Anesthesia Other Findings   Reproductive/Obstetrics negative OB ROS                             Anesthesia Physical Anesthesia Plan  ASA: 3  Anesthesia Plan: General   Post-op Pain Management: Minimal or no pain anticipated and Regional block*   Induction: Intravenous  PONV Risk Score and Plan: 2 and Ondansetron, Dexamethasone, Treatment may vary due to age or medical condition and Midazolam  Airway Management Planned: Oral ETT and LMA  Additional Equipment: None  Intra-op Plan:   Post-operative Plan: Extubation in OR  Informed Consent: I have reviewed the patients History and Physical, chart, labs and discussed the procedure including the risks, benefits and alternatives for the proposed anesthesia with the patient or authorized representative who has indicated his/her understanding and acceptance.     Dental advisory  given  Plan Discussed with: Anesthesiologist and CRNA  Anesthesia Plan Comments: (Discussed both nerve block for pain relief post-op and GA; including NV, sore throat, dental injury, and pulmonary complications)        Anesthesia Quick Evaluation

## 2022-10-02 NOTE — H&P (Signed)
[facility]    Chief Complaint: Right shoulder rotator cuff tear arthropathy HPI: The patient is a 72 y.o. male With chronic and progressive increasing right shoulder pain related to severe rotator cuff tear arthropathy.  Due to his increasing functional limitations and failure to respond to prolonged attempts of conservative management, he is brought to the operating room at this time for planned right shoulder reverse arthroplasty  Past Medical History:  Diagnosis Date   Arthritis    Chronic kidney disease    Diabetes mellitus without complication ([place])    Gout    High cholesterol    Hypertension       Past Surgical History:  Procedure Laterality Date   APPENDECTOMY  03/28/1967   CATARACT EXTRACTION Bilateral    COLONOSCOPY      Family History  Problem Relation Age of Onset   Cancer Mother        breast   Cancer Father        prostate   Diabetes Father     Social History:  reports that he quit smoking about 47 years ago. His smoking use included cigarettes. He started smoking about 50 years ago. He has a 3.00 pack-year smoking history. He has never used smokeless tobacco. He reports that he does not currently use alcohol. He reports that he does not use drugs.  BMI: Estimated body mass index is 39.89 kg/m as calculated from the following:   Height as of 09/29/22: '5\' 11"'$  (1.803 m).   Weight as of 09/29/22: 129.7 kg.  No results found for: "ALBUMIN" Diabetes: Patient does not have a diagnosis of diabetes. Lab Results  Component Value Date   HGBA1C 6.3 (H) 09/29/2022     Smoking Status:       No medications prior to admission.     Physical Exam: Right shoulder demonstrates painful and guarded motion as noted at his recent office visits.  He remains neurovascular intact in the right upper extremity.  Imaging studies confirmed changes consistent with chronic rotator cuff tear arthropathy of the right shoulder  Vitals     Assessment/Plan  Impression:  Right shoulder rotator cuff tear arthropathy  Plan of Action: Procedure(s): REVERSE SHOULDER ARTHROPLASTY  Lucky Trotta M Ein Rijo 10/02/2022, 5:40 AM Contact # [phone]

## 2022-10-02 NOTE — Transfer of Care (Signed)
Immediate Anesthesia Transfer of Care Note  Patient: Dwayne Gomez  Procedure(s) Performed: REVERSE SHOULDER ARTHROPLASTY (Right: Shoulder)  Patient Location: PACU  Anesthesia Type:General  Level of Consciousness: awake  Airway & Oxygen Therapy: Patient Spontanous Breathing and Patient connected to face mask oxygen  Post-op Assessment: Report given to RN and Post -op Vital signs reviewed and stable  Post vital signs: Reviewed and stable  Last Vitals:  Vitals Value Taken Time  BP    Temp    Pulse 75 10/02/22 1640  Resp 15 10/02/22 1640  SpO2 100 % 10/02/22 1640  Vitals shown include unvalidated device data.  Last Pain:  Vitals:   10/02/22 1426  TempSrc:   PainSc: 0-No pain         Complications:  Encounter Notable Events  Notable Event Outcome Phase Comment  Difficult to intubate - expected  Intraprocedure Filed from anesthesia note documentation.

## 2022-10-02 NOTE — Op Note (Signed)
10/02/2022  4:14 PM  PATIENT:   Dwayne Gomez  72 y.o. male  PRE-OPERATIVE DIAGNOSIS:  Right shoulder rotator cuff tear arthropathy  POST-OPERATIVE DIAGNOSIS: Same  PROCEDURE: Right shoulder reverse arthroplasty utilizing a press-fit size 11 Arthrex stem with a neutral metathesis, +6 spacer, +3 polyethylene insert, 39/+4 glenosphere and a small/+2 baseplate  SURGEON:  Robecca Fulgham, Metta Clines M.D.  ASSISTANTS: Jenetta Loges, PA-C  Jenetta Loges, PA-C was utilized as an Environmental consultant throughout this case, essential for help with positioning the patient, positioning extremity, tissue manipulation, implantation of the prosthesis, suture management, wound closure, and intraoperative decision-making.  ANESTHESIA:   General endotracheal and interscalene block with Exparel  EBL: 150 cc  SPECIMEN: None  Drains: None   PATIENT DISPOSITION:  PACU - hemodynamically stable.    PLAN OF CARE: Discharge to home after PACU  Brief history:  Patient is a 72 year old gentleman followed for chronic bilateral shoulder pain, right currently much more symptomatic than the left, with known bilateral shoulder rotator cuff tear arthropathies.  Due to his increasing pain and functional imitations he is brought to the operating this time for planned right shoulder reverse arthroplasty.  Preoperatively, I counseled the patient regarding treatment options and risks versus benefits thereof.  Possible surgical complications were all reviewed including potential for bleeding, infection, neurovascular injury, persistent pain, loss of motion, anesthetic complication, failure of the implant, and possible need for additional surgery. They understand and accept and agrees with our planned procedure.   Procedure in detail:  After undergoing routine preop evaluation the patient received prophylactic antibiotics and interscalene block with Exparel was established in the holding area by the anesthesia department.  Subsequently  placed spine on the operating table and underwent the smooth induction of general endotracheal anesthesia.  Placed into the beachchair position and appropriately padded and protected.  The right shoulder girdle region was sterilely prepped and draped in standard fashion.  Timeout was called.  A deltopectoral approach to the right shoulder was made through an approximately 10 cm incision.  Skin flaps were elevated dissection carried deeply and the deltopectoral interval was then developed from proximal to distal with the vein taken laterally.  Upper centimeter the pectoralis major tendon was tenotomized for exposure of the conjoined tendon mobilized and retracted medially.  Adhesions were divided beneath the deltoid.  The long head biceps tendon was then tenodesed at the upper border the pectoralis major tendon with the proximal segment unroofed and excised.  The superior remnant of the rotator cuff was split from the apex of the bicipital groove to the base of the coracoid and the subscap was separated from the lesser tuberosity using electrocautery and tagged with a pair of grasping suture tape sutures.  Capsular attachments were then divided from the anterior and inferior margins of the humeral neck and the humeral head was then delivered through the wound.  An extra medullary guide was then used to outline the proposed humeral head resection which we performed with an oscillating saw at approximate 20 degrees of retroversion.  A metal cap was then placed over the cut proximal humeral surface.  The glenoid was then exposed and a circumferential labral resection was performed.  A guidepin was then directed into the center of the glenoid and the glenoid was then prepared with the central followed by the peripheral reamer to a stable subchondral bony bed.  Preparation completed with the drill and tap for a 35 mm lag screw.  Our baseplate was then assembled.  Vancomycin powder  applied to the threads of the lag screw  the baseplate was then inserted achieving excellent purchase and fixation.  The peripheral locking screws were all then placed using standard technique with excellent purchase and fixation.  A 39/+4 glenosphere was then impacted onto the baseplate and a central locking screw was placed.  Attention returned to the metaphysis where the canal was opened by hand reaming and broached up to a size 11 at approximately 20 degrees of retroversion.  A neutral metaphyseal velum reaming guide was used to prepare the metaphysis.  A trial implant was then placed and trial reduction showed excellent motor stability and soft tissue balance.  This point the trial was removed.  The canal was irrigated cleaned and dried.  The final implant was assembled.  Vancomycin powder applied into the canal and the implant was then seated with excellent fixation.  A series of trial reduction was then performed and we ultimately felt that the +9 off the implant gave is the best motion stability and soft tissue balance.  Trials removed.  A +6 spacer was then applied to the implant +3 poly was impacted and final reduction showed excellent motion stability and soft tissue balance all much to our satisfaction.  The joint was then copious irrigated.  The subscapularis was then evaluated and found to have very poor elasticity despite soft tissue releases and so we did not repair the subscapularis.  The wound was copious irrigated.  Final hemostasis was obtained.  The balance of the vancomycin powder was then sprayed liberally throughout the deep soft tissue planes.  The deltopectoral interval was then reapproximated with a series of figure-of-eight number Vicryl sutures.  2-0 Monocryl used to close the subcu layer and intracuticular 3-0 Monocryl used to close the skin followed by Dermabond and Aquacel dressing.  The right arm was placed into a sling.  The patient was awakened, extubated, and taken to the recovery room in stable condition.  Dwayne Gomez   Contact # [phone]

## 2022-10-03 NOTE — Anesthesia Postprocedure Evaluation (Signed)
Anesthesia Post Note  Patient: EDRIS SWEDA  Procedure(s) Performed: REVERSE SHOULDER ARTHROPLASTY (Right: Shoulder)     Patient location during evaluation: PACU Anesthesia Type: General Level of consciousness: awake and alert Pain management: pain level controlled Vital Signs Assessment: post-procedure vital signs reviewed and stable Respiratory status: spontaneous breathing, nonlabored ventilation, respiratory function stable and patient connected to nasal cannula oxygen Cardiovascular status: blood pressure returned to baseline and stable Postop Assessment: no apparent nausea or vomiting Anesthetic complications: yes   Encounter Notable Events  Notable Event Outcome Phase Comment  Difficult to intubate - expected  Intraprocedure Filed from anesthesia note documentation.    Last Vitals:  Vitals:   10/02/22 1745 10/02/22 1919  BP: (!) 158/71 (!) 148/62  Pulse: 67 68  Resp: 12 16  Temp:  36.6 C  SpO2: 92% 93%    Last Pain:  Vitals:   10/02/22 1919  TempSrc:   PainSc: 3                  Avelynn Sellin

## 2022-10-05 ENCOUNTER — Encounter (HOSPITAL_COMMUNITY): Payer: Medicare PPO

## 2022-10-07 ENCOUNTER — Encounter (HOSPITAL_COMMUNITY): Payer: Self-pay | Admitting: Orthopedic Surgery

## 2023-03-18 NOTE — Patient Instructions (Addendum)
SURGICAL WAITING ROOM VISITATION Patients having surgery or a procedure may have no more than 2 support people in the waiting area - these visitors may rotate in the visitor waiting room.   Due to an increase in RSV and influenza rates and associated hospitalizations, children ages 69 and under may not visit patients in [facility] hospitals. If the patient needs to stay at the hospital during part of their recovery, the visitor guidelines for inpatient rooms apply.  PRE-OP VISITATION  Pre-op nurse will coordinate an appropriate time for 1 support person to accompany the patient in pre-op.  This support person may not rotate.  This visitor will be contacted when the time is appropriate for the visitor to come back in the pre-op area.  Please refer to the [facility] website for the visitor guidelines for Inpatients (after your surgery is over and you are in a regular room).  You are not required to quarantine at this time prior to your surgery. However, you must do this: Hand Hygiene often Do NOT share personal items Notify your provider if you are in close contact with someone who has COVID or you develop fever 100.4 or greater, new onset of sneezing, cough, sore throat, shortness of breath or body aches.  If you test positive for Covid or have been in contact with anyone that has tested positive in the last 10 days please notify you surgeon.    Your procedure is scheduled on:  Tuesday  March 30, 2023  Report to [facility] Main Entrance: [facility]sen entrance where the [employer] is available.   Report to admitting at: 07:15    AM  Call this number if you have any questions or problems the morning of surgery [phone]  Do not eat food after Midnight the night prior to your surgery/procedure.  After Midnight you may have the following liquids until  06:45     AM  DAY OF SURGERY  Clear Liquid Diet Water Black Coffee (sugar ok, NO MILK/CREAM OR CREAMERS)  Tea (sugar ok,  NO MILK/CREAM OR CREAMERS) regular and decaf                             Plain Jell-O  with no fruit (NO RED)                                           Fruit ices (not with fruit pulp, NO RED)                                     Popsicles (NO RED)                                                                  Juice: NO CITRUS JUICES: only apple, WHITE grape, WHITE cranberry Sports drinks like Gatorade or Powerade (NO RED)                   The day of surgery:  Drink ONE (1) Pre-Surgery G2 at 06:45   AM  the morning of surgery. Drink in one sitting. Do not sip.  This drink was given to you during your hospital pre-op appointment visit. Nothing else to drink after completing the Pre-Surgery G2 : No candy, chewing gum or throat lozenges.    FOLLOW  ANY ADDITIONAL PRE OP INSTRUCTIONS YOU RECEIVED FROM YOUR SURGEON'S OFFICE!!!   Oral Hygiene is also important to reduce your risk of infection.        Remember - BRUSH YOUR TEETH THE MORNING OF SURGERY WITH YOUR REGULAR TOOTHPASTE  Do NOT smoke after Midnight the night before surgery.  TOUJEO injections:  Day before surgery;  inject 50% of your usual dose. (27 units)                                 Day of your surgery:  inject 27 units.   STOP TAKING all Vitamins, Herbs and supplements 1 week before your surgery.   Take ONLY these medicines the morning of surgery with A SIP OF WATER:  hydralazine.  You may take Tylenol if needed.    If You have been diagnosed with Sleep Apnea - Bring CPAP mask and tubing day of surgery. We will provide you with a CPAP machine on the day of your surgery.                   You may not have any metal on your body including jewelry, and body piercing  Do not wear  lotions, powders, cologne, or deodorant  Men may shave face and neck.  Contacts, Hearing Aids, dentures or bridgework may not be worn into surgery. DENTURES WILL BE REMOVED PRIOR TO SURGERY PLEASE DO NOT APPLY "Poly grip" OR ADHESIVES!!!  You  may bring a small overnight bag with you on the day of surgery, only pack items that are not valuable. [facility] IS NOT RESPONSIBLE   FOR VALUABLES THAT ARE LOST OR STOLEN.    Do not bring your home medications to the hospital. The Pharmacy will dispense medications listed on your medication list to you during your admission in the Hospital.  Special Instructions: Bring a copy of your healthcare power of attorney and living will documents the day of surgery, if you wish to have them scanned into your [place] [facility] Records- EPIC  Please read over the following fact sheets you were given: IF YOU HAVE QUESTIONS ABOUT YOUR PRE-OP INSTRUCTIONS, PLEASE CALL [phone].     Pre-operative 5 CHG Bath Instructions   You can play a key role in reducing the risk of infection after surgery. Your skin needs to be as free of germs as possible. You can reduce the number of germs on your skin by washing with CHG (chlorhexidine gluconate) soap before surgery. CHG is an antiseptic soap that kills germs and continues to kill germs even after washing.   DO NOT use if you have an allergy to chlorhexidine/CHG or antibacterial soaps. If your skin becomes reddened or irritated, stop using the CHG and notify one of our RNs at [phone]  Please shower with the CHG soap starting 4 days before surgery using the following schedule: START SHOWERS ON  FRIDAY  March 26, 2023  Please keep in mind the following:  DO NOT shave, including legs and underarms, starting the day of your first shower.   You may shave your face at any point before/day of surgery.   Place clean sheets on your bed the day you start using CHG soap. Use a clean washcloth (not used since being washed) for each shower. DO NOT sleep with pets once you start  using the CHG.   CHG Shower Instructions:  If you choose to wash your hair and private area, wash first with your normal shampoo/soap.  After you use shampoo/soap, rinse your hair and body thoroughly to remove shampoo/soap residue.  Turn the water OFF and apply about 3 tablespoons (45 ml) of CHG soap to a CLEAN washcloth.  Apply CHG soap ONLY FROM YOUR NECK DOWN TO YOUR TOES (washing for 3-5 minutes)  DO NOT use CHG soap on face, private areas, open wounds, or sores.  Pay special attention to the area where your surgery is being performed.  If you are having back surgery, having someone wash your back for you may be helpful.  Wait 2 minutes after CHG soap is applied, then you may rinse off the CHG soap.  Pat dry with a clean towel  Put on clean clothes/pajamas   If you choose to wear lotion, please use ONLY the CHG-compatible lotions on the back of this paper.     Additional instructions for the day of surgery: DO NOT APPLY any lotions, deodorants, cologne, or perfumes.   Put on clean/comfortable clothes.  Brush your teeth.  Ask your nurse before applying any prescription medications to the skin.      CHG Compatible Lotions   Aveeno Moisturizing lotion  Cetaphil Moisturizing Cream  Cetaphil Moisturizing Lotion  Clairol Herbal Essence Moisturizing Lotion, Dry Skin  Clairol Herbal Essence Moisturizing Lotion, Extra Dry Skin  Clairol Herbal Essence Moisturizing Lotion, Normal Skin  Curel Age Defying Therapeutic Moisturizing Lotion with Alpha Hydroxy  Curel Extreme Care Body Lotion  Curel Soothing Hands Moisturizing Hand Lotion  Curel Therapeutic Moisturizing Cream, Fragrance-Free  Curel Therapeutic Moisturizing Lotion, Fragrance-Free  Curel Therapeutic Moisturizing Lotion, Original Formula  Eucerin Daily Replenishing Lotion  Eucerin Dry Skin Therapy Plus Alpha Hydroxy Crme  Eucerin Dry Skin Therapy Plus Alpha Hydroxy Lotion  Eucerin Original Crme  Eucerin Original Lotion   Eucerin Plus Crme Eucerin Plus Lotion  Eucerin TriLipid Replenishing Lotion  Keri Anti-Bacterial Hand Lotion  Keri Deep Conditioning Original Lotion Dry Skin Formula Softly Scented  Keri Deep Conditioning Original Lotion, Fragrance Free Sensitive Skin Formula  Keri Lotion Fast Absorbing Fragrance Free Sensitive Skin Formula  Keri Lotion Fast Absorbing Softly Scented Dry Skin Formula  Keri Original Lotion  Keri Skin Renewal Lotion Keri Silky Smooth Lotion  Keri Silky Smooth Sensitive Skin Lotion  Nivea Body Creamy Conditioning Oil  Nivea Body Extra Enriched Lotion  Nivea Body Original Lotion  Nivea Body Sheer Moisturizing Lotion Nivea Crme  Nivea Skin Firming Lotion  NutraDerm 30 Skin Lotion  NutraDerm Skin Lotion  NutraDerm Therapeutic Skin Cream  NutraDerm Therapeutic Skin Lotion  ProShield Protective Hand Cream  Provon moisturizing lotion   FAILURE TO FOLLOW THESE INSTRUCTIONS MAY RESULT IN THE CANCELLATION OF YOUR SURGERY  PATIENT SIGNATURE_________________________________  NURSE SIGNATURE__________________________________  ________________________________________________________________________       Rogelia Mire    An incentive spirometer is a tool that can help keep your lungs clear and active. This tool measures how well you are filling your lungs with each breath. Taking  long deep breaths may help reverse or decrease the chance of developing breathing (pulmonary) problems (especially infection) following: A long period of time when you are unable to move or be active. BEFORE THE PROCEDURE  If the spirometer includes an indicator to show your best effort, your nurse or respiratory therapist will set it to a desired goal. If possible, sit up straight or lean slightly forward. Try not to slouch. Hold the incentive spirometer in an upright position. INSTRUCTIONS FOR USE  Sit on the edge of your bed if possible, or sit up as far as you can in bed or on a  chair. Hold the incentive spirometer in an upright position. Breathe out normally. Place the mouthpiece in your mouth and seal your lips tightly around it. Breathe in slowly and as deeply as possible, raising the piston or the ball toward the top of the column. Hold your breath for 3-5 seconds or for as long as possible. Allow the piston or ball to fall to the bottom of the column. Remove the mouthpiece from your mouth and breathe out normally. Rest for a few seconds and repeat Steps 1 through 7 at least 10 times every 1-2 hours when you are awake. Take your time and take a few normal breaths between deep breaths. The spirometer may include an indicator to show your best effort. Use the indicator as a goal to work toward during each repetition. After each set of 10 deep breaths, practice coughing to be sure your lungs are clear. If you have an incision (the cut made at the time of surgery), support your incision when coughing by placing a pillow or rolled up towels firmly against it. Once you are able to get out of bed, walk around indoors and cough well. You may stop using the incentive spirometer when instructed by your caregiver.  RISKS AND COMPLICATIONS Take your time so you do not get dizzy or light-headed. If you are in pain, you may need to take or ask for pain medication before doing incentive spirometry. It is harder to take a deep breath if you are having pain. AFTER USE Rest and breathe slowly and easily. It can be helpful to keep track of a log of your progress. Your caregiver can provide you with a simple table to help with this. If you are using the spirometer at home, follow these instructions: SEEK MEDICAL CARE IF:  You are having difficultly using the spirometer. You have trouble using the spirometer as often as instructed. Your pain medication is not giving enough relief while using the spirometer. You develop fever of 100.5 F (38.1 C) or higher.                                                                                                     SEEK IMMEDIATE MEDICAL CARE IF:  You cough up bloody sputum that had not been present before. You develop fever of 102 F (38.9 C) or greater. You develop worsening pain at or near the incision site. MAKE SURE YOU:  Understand these instructions. Will watch your condition.  Will get help right away if you are not doing well or get worse. Document Released: 11/23/2006 Document Revised: 10/05/2011 Document Reviewed: 01/24/2007 [facility] Patient Information 2014 [place], [place].

## 2023-03-18 NOTE — Progress Notes (Addendum)
COVID Vaccine received:  []  No [x]  Yes Date of any COVID positive Test in last 90 days:   none  PCP -Chilton Greathouse, PA  at [facility]  Med.  Cardiologist -  none Pulmonology- Jetty Duhamel , MD Nephrology- Lisette Abu, MD   Chest x-ray - 06-25-2017  2v  Epic EKG -  09-29-2022  Epic Stress Test -  ECHO -  Cardiac Cath -   PCR screen: [x]  Ordered & Completed           []   No Order but Needs PROFEND           []   N/A for this surgery  Surgery Plan:  []  Ambulatory                            [x]  Outpatient in bed                            []  Admit  Anesthesia:    []  General  [x]  Spinal                           []   Choice []   MAC  Pacemaker / ICD device [x]  No []  Yes   Spinal Cord Stimulator:[x]  No []  Yes       History of Sleep Apnea? []  No [x]  Yes   CPAP used?- []  No [x]  Yes    Does the patient monitor blood sugar?          []  No [x]  Yes  []  N/A Last A1c was 6.1 on 02-22-2023   Patient has: []  NO Hx DM   [x]  Pre-DM                 []  DM1  []   DM2 Does patient have a [employer] or Dexacom? [x]  No []  Yes   Fasting Blood Sugar Ranges- 95-105 Checks Blood Sugar __1    times a day  Diabetic medications/ instructions: TOUJEO  injection- 55 units q pm Per Nephrology- start 50% dose the day before surgery and on DOS  Blood Thinner / Instructions: None Aspirin Instructions: None  ERAS Protocol Ordered: []  No  [x]  Yes PRE-SURGERY []  ENSURE  [x]  G2  Patient is to be NPO after: 06:45  Comments: Patient was given the 5 CHG shower / bath instructions for TKA  surgery along with 2 bottles of the CHG soap. Patient will start this on:  Friday  03-26-2023     All questions were asked and answered, Patient voiced understanding of this process.   Activity level: Patient is able to climb a flight of stairs without difficulty; [x]  No CP  but would have SOB and Leg pain.   Patient can perform ADLs without assistance.   Anesthesia review: OSA-CPAP, 1 AVB, arrhythmias,  CKD3b / early stage 4, Pre-DM, HTN, Gout, Anemia.  Patient denies shortness of breath, fever, cough and chest pain at PAT appointment.  Patient verbalized understanding and agreement to the Pre-Surgical Instructions that were given to them at this PAT appointment. Patient was also educated of the need to review these PAT instructions again prior to his surgery.I reviewed the appropriate phone numbers to call if they have any and questions or concerns.

## 2023-03-19 ENCOUNTER — Other Ambulatory Visit: Payer: Self-pay

## 2023-03-19 ENCOUNTER — Encounter (HOSPITAL_COMMUNITY)
Admission: RE | Admit: 2023-03-19 | Discharge: 2023-03-19 | Disposition: A | Discharge status: Home / Self Care | Payer: Medicare PPO | Source: Ambulatory Visit | Attending: Orthopedic Surgery | Admitting: Orthopedic Surgery

## 2023-03-19 ENCOUNTER — Encounter (HOSPITAL_COMMUNITY): Payer: Self-pay

## 2023-03-19 VITALS — BP 123/58 | HR 70 | Temp 98.0°F | Resp 18 | Ht 70.0 in | Wt 268.0 lb

## 2023-03-19 DIAGNOSIS — Z01818 Encounter for other preprocedural examination: Secondary | ICD-10-CM

## 2023-03-19 DIAGNOSIS — I10 Essential (primary) hypertension: Secondary | ICD-10-CM | POA: Diagnosis not present

## 2023-03-19 DIAGNOSIS — Z01812 Encounter for preprocedural laboratory examination: Secondary | ICD-10-CM | POA: Diagnosis present

## 2023-03-19 HISTORY — DX: Anemia, unspecified: D64.9

## 2023-03-19 HISTORY — DX: Prediabetes: R73.03

## 2023-03-19 HISTORY — DX: Family history of other specified conditions: Z84.89

## 2023-03-19 LAB — CBC
HCT: 34.7 % — ABNORMAL LOW (ref 39.0–52.0)
Hemoglobin: 11.6 g/dL — ABNORMAL LOW (ref 13.0–17.0)
MCH: 32.5 pg (ref 26.0–34.0)
MCHC: 33.4 g/dL (ref 30.0–36.0)
MCV: 97.2 fL (ref 80.0–100.0)
Platelets: 530 10*3/uL — ABNORMAL HIGH (ref 150–400)
RBC: 3.57 MIL/uL — ABNORMAL LOW (ref 4.22–5.81)
RDW: 14.5 % (ref 11.5–15.5)
WBC: 13.6 10*3/uL — ABNORMAL HIGH (ref 4.0–10.5)
nRBC: 0 % (ref 0.0–0.2)

## 2023-03-19 LAB — BASIC METABOLIC PANEL
Anion gap: 10 (ref 5–15)
BUN: 67 mg/dL — ABNORMAL HIGH (ref 8–23)
CO2: 21 mmol/L — ABNORMAL LOW (ref 22–32)
Calcium: 9.3 mg/dL (ref 8.9–10.3)
Chloride: 104 mmol/L (ref 98–111)
Creatinine, Ser: 2.05 mg/dL — ABNORMAL HIGH (ref 0.61–1.24)
GFR, Estimated: 34 mL/min — ABNORMAL LOW (ref >60)
Glucose, Bld: 175 mg/dL — ABNORMAL HIGH (ref 70–99)
Potassium: 5.3 mmol/L — ABNORMAL HIGH (ref 3.5–5.1)
Sodium: 135 mmol/L (ref 135–145)

## 2023-03-19 LAB — SURGICAL PCR SCREEN
MRSA, PCR: NEGATIVE
Staphylococcus aureus: NEGATIVE

## 2023-03-30 ENCOUNTER — Observation Stay (HOSPITAL_COMMUNITY)
Admission: RE | Admit: 2023-03-30 | Discharge: 2023-03-31 | Disposition: A | Discharge status: Home / Self Care | Payer: Medicare PPO | Attending: Orthopedic Surgery | Admitting: Orthopedic Surgery

## 2023-03-30 ENCOUNTER — Encounter (HOSPITAL_COMMUNITY): Payer: Self-pay | Admitting: Orthopedic Surgery

## 2023-03-30 ENCOUNTER — Encounter (HOSPITAL_COMMUNITY)
Admission: RE | Disposition: A | Discharge status: Home / Self Care | Payer: Self-pay | Source: Home / Self Care | Attending: Orthopedic Surgery

## 2023-03-30 ENCOUNTER — Ambulatory Visit (HOSPITAL_COMMUNITY): Payer: Medicare PPO | Admitting: Anesthesiology

## 2023-03-30 ENCOUNTER — Other Ambulatory Visit: Payer: Self-pay

## 2023-03-30 ENCOUNTER — Ambulatory Visit (HOSPITAL_BASED_OUTPATIENT_CLINIC_OR_DEPARTMENT_OTHER): Payer: Medicare PPO | Admitting: Anesthesiology

## 2023-03-30 DIAGNOSIS — I10 Essential (primary) hypertension: Secondary | ICD-10-CM | POA: Diagnosis not present

## 2023-03-30 DIAGNOSIS — N189 Chronic kidney disease, unspecified: Secondary | ICD-10-CM | POA: Diagnosis not present

## 2023-03-30 DIAGNOSIS — Z96611 Presence of right artificial shoulder joint: Secondary | ICD-10-CM | POA: Insufficient documentation

## 2023-03-30 DIAGNOSIS — M1711 Unilateral primary osteoarthritis, right knee: Secondary | ICD-10-CM

## 2023-03-30 DIAGNOSIS — Z87891 Personal history of nicotine dependence: Secondary | ICD-10-CM | POA: Diagnosis not present

## 2023-03-30 DIAGNOSIS — Z79899 Other long term (current) drug therapy: Secondary | ICD-10-CM | POA: Insufficient documentation

## 2023-03-30 DIAGNOSIS — G4733 Obstructive sleep apnea (adult) (pediatric): Secondary | ICD-10-CM

## 2023-03-30 DIAGNOSIS — I129 Hypertensive chronic kidney disease with stage 1 through stage 4 chronic kidney disease, or unspecified chronic kidney disease: Secondary | ICD-10-CM | POA: Insufficient documentation

## 2023-03-30 DIAGNOSIS — Z96651 Presence of right artificial knee joint: Principal | ICD-10-CM

## 2023-03-30 HISTORY — PX: TOTAL KNEE ARTHROPLASTY: SHX125

## 2023-03-30 LAB — POCT I-STAT, CHEM 8
BUN: 81 mg/dL — ABNORMAL HIGH (ref 8–23)
Calcium, Ion: 1.1 mmol/L — ABNORMAL LOW (ref 1.15–1.40)
Chloride: 106 mmol/L (ref 98–111)
Creatinine, Ser: 2.4 mg/dL — ABNORMAL HIGH (ref 0.61–1.24)
Glucose, Bld: 117 mg/dL — ABNORMAL HIGH (ref 70–99)
HCT: 29 % — ABNORMAL LOW (ref 39.0–52.0)
Hemoglobin: 9.9 g/dL — ABNORMAL LOW (ref 13.0–17.0)
Potassium: 4.4 mmol/L (ref 3.5–5.1)
Sodium: 134 mmol/L — ABNORMAL LOW (ref 135–145)
TCO2: 16 mmol/L — ABNORMAL LOW (ref 22–32)

## 2023-03-30 LAB — GLUCOSE, CAPILLARY
Glucose-Capillary: 129 mg/dL — ABNORMAL HIGH (ref 70–99)
Glucose-Capillary: 143 mg/dL — ABNORMAL HIGH (ref 70–99)
Glucose-Capillary: 181 mg/dL — ABNORMAL HIGH (ref 70–99)
Glucose-Capillary: 186 mg/dL — ABNORMAL HIGH (ref 70–99)

## 2023-03-30 LAB — HEMOGLOBIN A1C
Hgb A1c MFr Bld: 6.1 % — ABNORMAL HIGH (ref 4.8–5.6)
Mean Plasma Glucose: 128.37 mg/dL

## 2023-03-30 SURGERY — ARTHROPLASTY, KNEE, TOTAL
Anesthesia: Regional | Site: Knee | Laterality: Right

## 2023-03-30 MED ORDER — MENTHOL 3 MG MT LOZG: 1.0000 | LOZENGE | OROMUCOSAL | Status: DC | PRN

## 2023-03-30 MED ORDER — DEXAMETHASONE SODIUM PHOSPHATE 10 MG/ML IJ SOLN
INTRAMUSCULAR | Status: DC | PRN
  Administered 2023-03-30: 10 mg

## 2023-03-30 MED ORDER — SODIUM CHLORIDE (PF) 0.9 % IJ SOLN
INTRAMUSCULAR | Status: DC | PRN
  Administered 2023-03-30: 30 mL via INTRAVENOUS

## 2023-03-30 MED ORDER — INSULIN ASPART 100 UNIT/ML IJ SOLN
0.0000 [IU] | Freq: Three times a day (TID) | INTRAMUSCULAR | Status: DC
  Administered 2023-03-30 – 2023-03-31 (×2): 3 [IU] via SUBCUTANEOUS

## 2023-03-30 MED ORDER — ORAL CARE MOUTH RINSE: 15.0000 mL | Freq: Once | OROMUCOSAL | Status: AC

## 2023-03-30 MED ORDER — SPIRONOLACTONE 25 MG PO TABS
25.0000 mg | ORAL_TABLET | Freq: Two times a day (BID) | ORAL | Status: DC
  Administered 2023-03-30 – 2023-03-31 (×2): 25 mg via ORAL
  Filled 2023-03-30 (×2): qty 1

## 2023-03-30 MED ORDER — PHENOL 1.4 % MT LIQD: 1.0000 | OROMUCOSAL | Status: DC | PRN

## 2023-03-30 MED ORDER — DEXAMETHASONE SODIUM PHOSPHATE 10 MG/ML IJ SOLN
INTRAMUSCULAR | Status: DC | PRN
  Administered 2023-03-30: 10 mg via INTRAVENOUS

## 2023-03-30 MED ORDER — TRANEXAMIC ACID-NACL 1000-0.7 MG/100ML-% IV SOLN
1000.0000 mg | Freq: Once | INTRAVENOUS | Status: AC
  Administered 2023-03-30: 1000 mg via INTRAVENOUS
  Filled 2023-03-30: qty 100

## 2023-03-30 MED ORDER — ACETAMINOPHEN 500 MG PO TABS
ORAL_TABLET | ORAL | Status: AC
  Filled 2023-03-30: qty 2

## 2023-03-30 MED ORDER — CEFAZOLIN SODIUM-DEXTROSE 2-4 GM/100ML-% IV SOLN
2.0000 g | Freq: Four times a day (QID) | INTRAVENOUS | Status: AC
  Administered 2023-03-30 (×2): 2 g via INTRAVENOUS
  Filled 2023-03-30 (×2): qty 100

## 2023-03-30 MED ORDER — BUPIVACAINE-EPINEPHRINE 0.25% -1:200000 IJ SOLN
INTRAMUSCULAR | Status: AC
  Filled 2023-03-30: qty 1

## 2023-03-30 MED ORDER — CEFAZOLIN IN SODIUM CHLORIDE 3-0.9 GM/100ML-% IV SOLN
INTRAVENOUS | Status: AC
  Filled 2023-03-30: qty 100

## 2023-03-30 MED ORDER — BISACODYL 10 MG RE SUPP: 10.0000 mg | Freq: Every day | RECTAL | Status: DC | PRN

## 2023-03-30 MED ORDER — SODIUM CHLORIDE 0.9 % IR SOLN
Status: DC | PRN
  Administered 2023-03-30: 1000 mL

## 2023-03-30 MED ORDER — OXYCODONE HCL 5 MG PO TABS
5.0000 mg | ORAL_TABLET | ORAL | Status: DC | PRN
  Administered 2023-03-30 – 2023-03-31 (×4): 10 mg via ORAL
  Filled 2023-03-30 (×4): qty 2

## 2023-03-30 MED ORDER — METHOCARBAMOL 500 MG PO TABS
500.0000 mg | ORAL_TABLET | Freq: Four times a day (QID) | ORAL | Status: DC | PRN
  Administered 2023-03-30: 500 mg via ORAL
  Filled 2023-03-30: qty 1

## 2023-03-30 MED ORDER — SODIUM BICARBONATE 650 MG PO TABS
650.0000 mg | ORAL_TABLET | Freq: Two times a day (BID) | ORAL | Status: DC
  Administered 2023-03-30 – 2023-03-31 (×2): 650 mg via ORAL
  Filled 2023-03-30 (×2): qty 1

## 2023-03-30 MED ORDER — ROPIVACAINE HCL 5 MG/ML IJ SOLN
INTRAMUSCULAR | Status: DC | PRN
Start: 2023-03-30 — End: 2023-03-30
  Administered 2023-03-30: 20 mL via PERINEURAL

## 2023-03-30 MED ORDER — SODIUM CHLORIDE (PF) 0.9 % IJ SOLN
INTRAMUSCULAR | Status: AC
  Filled 2023-03-30: qty 30

## 2023-03-30 MED ORDER — ONDANSETRON HCL 4 MG/2ML IJ SOLN
INTRAMUSCULAR | Status: DC | PRN
  Administered 2023-03-30: 4 mg via INTRAVENOUS

## 2023-03-30 MED ORDER — DEXAMETHASONE SODIUM PHOSPHATE 10 MG/ML IJ SOLN: 8.0000 mg | Freq: Once | INTRAMUSCULAR | Status: DC

## 2023-03-30 MED ORDER — METOCLOPRAMIDE HCL 5 MG/ML IJ SOLN: 5.0000 mg | Freq: Three times a day (TID) | INTRAMUSCULAR | Status: DC | PRN

## 2023-03-30 MED ORDER — FENTANYL CITRATE PF 50 MCG/ML IJ SOSY: 25.0000 ug | PREFILLED_SYRINGE | INTRAMUSCULAR | Status: DC | PRN

## 2023-03-30 MED ORDER — MIDAZOLAM HCL 2 MG/2ML IJ SOLN
1.0000 mg | INTRAMUSCULAR | Status: DC
  Administered 2023-03-30: 2 mg via INTRAVENOUS
  Filled 2023-03-30: qty 2

## 2023-03-30 MED ORDER — LACTATED RINGERS IV SOLN: INTRAVENOUS | Status: DC

## 2023-03-30 MED ORDER — HYDROMORPHONE HCL 1 MG/ML IJ SOLN: 0.5000 mg | INTRAMUSCULAR | Status: DC | PRN

## 2023-03-30 MED ORDER — FUROSEMIDE 40 MG PO TABS: 40.0000 mg | ORAL_TABLET | Freq: Two times a day (BID) | ORAL | Status: DC | PRN

## 2023-03-30 MED ORDER — DILTIAZEM HCL ER COATED BEADS 180 MG PO CP24
360.0000 mg | ORAL_CAPSULE | Freq: Every day | ORAL | Status: DC
  Administered 2023-03-30: 360 mg via ORAL
  Filled 2023-03-30: qty 2

## 2023-03-30 MED ORDER — ACETAMINOPHEN 500 MG PO TABS
1000.0000 mg | ORAL_TABLET | Freq: Once | ORAL | Status: AC
  Administered 2023-03-30: 1000 mg via ORAL

## 2023-03-30 MED ORDER — DEXAMETHASONE SODIUM PHOSPHATE 10 MG/ML IJ SOLN
10.0000 mg | Freq: Once | INTRAMUSCULAR | Status: AC
  Administered 2023-03-31: 10 mg via INTRAVENOUS
  Filled 2023-03-30: qty 1

## 2023-03-30 MED ORDER — BUPIVACAINE IN DEXTROSE 0.75-8.25 % IT SOLN
INTRATHECAL | Status: DC | PRN
  Administered 2023-03-30: 1.6 mL via INTRATHECAL

## 2023-03-30 MED ORDER — PHENYLEPHRINE HCL-NACL 20-0.9 MG/250ML-% IV SOLN
INTRAVENOUS | Status: DC | PRN
Start: 2023-03-30 — End: 2023-03-30
  Administered 2023-03-30: 40 ug/min via INTRAVENOUS

## 2023-03-30 MED ORDER — KETOROLAC TROMETHAMINE 30 MG/ML IJ SOLN
INTRAMUSCULAR | Status: AC
  Filled 2023-03-30: qty 1

## 2023-03-30 MED ORDER — METOCLOPRAMIDE HCL 5 MG PO TABS: 5.0000 mg | ORAL_TABLET | Freq: Three times a day (TID) | ORAL | Status: DC | PRN

## 2023-03-30 MED ORDER — ACETAMINOPHEN 500 MG PO TABS
1000.0000 mg | ORAL_TABLET | Freq: Four times a day (QID) | ORAL | Status: DC
  Administered 2023-03-30 – 2023-03-31 (×3): 1000 mg via ORAL
  Filled 2023-03-30 (×3): qty 2

## 2023-03-30 MED ORDER — EPHEDRINE SULFATE-NACL 50-0.9 MG/10ML-% IV SOSY
PREFILLED_SYRINGE | INTRAVENOUS | Status: DC | PRN
Start: 2023-03-30 — End: 2023-03-30
  Administered 2023-03-30: 10 mg via INTRAVENOUS

## 2023-03-30 MED ORDER — ALLOPURINOL 300 MG PO TABS
300.0000 mg | ORAL_TABLET | Freq: Every day | ORAL | Status: DC
  Administered 2023-03-30 – 2023-03-31 (×2): 300 mg via ORAL
  Filled 2023-03-30 (×2): qty 1

## 2023-03-30 MED ORDER — PROPOFOL 10 MG/ML IV BOLUS
INTRAVENOUS | Status: DC | PRN
  Administered 2023-03-30: 40 mg via INTRAVENOUS

## 2023-03-30 MED ORDER — ONDANSETRON HCL 4 MG PO TABS: 4.0000 mg | ORAL_TABLET | Freq: Four times a day (QID) | ORAL | Status: DC | PRN

## 2023-03-30 MED ORDER — INSULIN GLARGINE-YFGN 100 UNIT/ML ~~LOC~~ SOLN
55.0000 [IU] | Freq: Every day | SUBCUTANEOUS | Status: DC
  Administered 2023-03-30: 55 [IU] via SUBCUTANEOUS
  Filled 2023-03-30 (×2): qty 0.55

## 2023-03-30 MED ORDER — PROPOFOL 500 MG/50ML IV EMUL
INTRAVENOUS | Status: DC | PRN
  Administered 2023-03-30: 125 ug/kg/min via INTRAVENOUS

## 2023-03-30 MED ORDER — DIPHENHYDRAMINE HCL 12.5 MG/5ML PO ELIX: 12.5000 mg | ORAL_SOLUTION | ORAL | Status: DC | PRN

## 2023-03-30 MED ORDER — OXYCODONE HCL 5 MG PO TABS: 10.0000 mg | ORAL_TABLET | ORAL | Status: DC | PRN

## 2023-03-30 MED ORDER — POLYETHYLENE GLYCOL 3350 17 G PO PACK
17.0000 g | PACK | Freq: Two times a day (BID) | ORAL | Status: DC
  Administered 2023-03-31: 17 g via ORAL
  Filled 2023-03-30: qty 1

## 2023-03-30 MED ORDER — FENTANYL CITRATE PF 50 MCG/ML IJ SOSY
50.0000 ug | PREFILLED_SYRINGE | INTRAMUSCULAR | Status: DC
  Administered 2023-03-30: 50 ug via INTRAVENOUS
  Filled 2023-03-30: qty 2

## 2023-03-30 MED ORDER — CEFAZOLIN IN SODIUM CHLORIDE 3-0.9 GM/100ML-% IV SOLN
3.0000 g | INTRAVENOUS | Status: AC
  Administered 2023-03-30: 3 g via INTRAVENOUS
  Filled 2023-03-30: qty 100

## 2023-03-30 MED ORDER — CHLORHEXIDINE GLUCONATE 0.12 % MT SOLN
15.0000 mL | Freq: Once | OROMUCOSAL | Status: AC
  Administered 2023-03-30: 15 mL via OROMUCOSAL

## 2023-03-30 MED ORDER — METHOCARBAMOL 500 MG IVPB - SIMPLE MED: 500.0000 mg | Freq: Four times a day (QID) | INTRAVENOUS | Status: DC | PRN

## 2023-03-30 MED ORDER — SODIUM CHLORIDE 0.9 % IV SOLN: INTRAVENOUS | Status: DC

## 2023-03-30 MED ORDER — HYDRALAZINE HCL 50 MG PO TABS
50.0000 mg | ORAL_TABLET | Freq: Two times a day (BID) | ORAL | Status: DC
  Administered 2023-03-30 – 2023-03-31 (×2): 50 mg via ORAL
  Filled 2023-03-30 (×2): qty 1

## 2023-03-30 MED ORDER — ASPIRIN 81 MG PO CHEW
81.0000 mg | CHEWABLE_TABLET | Freq: Two times a day (BID) | ORAL | Status: DC
  Administered 2023-03-30 – 2023-03-31 (×2): 81 mg via ORAL
  Filled 2023-03-30 (×2): qty 1

## 2023-03-30 MED ORDER — INSULIN GLARGINE (1 UNIT DIAL) 300 UNIT/ML ~~LOC~~ SOPN: 55.0000 [IU] | PEN_INJECTOR | Freq: Every evening | SUBCUTANEOUS | Status: DC

## 2023-03-30 MED ORDER — SENNA 8.6 MG PO TABS
2.0000 | ORAL_TABLET | Freq: Every day | ORAL | Status: DC
  Administered 2023-03-30: 17.2 mg via ORAL
  Filled 2023-03-30: qty 2

## 2023-03-30 MED ORDER — KETOROLAC TROMETHAMINE 30 MG/ML IJ SOLN
INTRAMUSCULAR | Status: DC | PRN
  Administered 2023-03-30: 30 mg via INTRAVENOUS

## 2023-03-30 MED ORDER — ONDANSETRON HCL 4 MG/2ML IJ SOLN: 4.0000 mg | Freq: Four times a day (QID) | INTRAMUSCULAR | Status: DC | PRN

## 2023-03-30 MED ORDER — STERILE WATER FOR IRRIGATION IR SOLN
Status: DC | PRN
  Administered 2023-03-30: 2000 mL

## 2023-03-30 MED ORDER — ALUM & MAG HYDROXIDE-SIMETH 200-200-20 MG/5ML PO SUSP: 30.0000 mL | ORAL | Status: DC | PRN

## 2023-03-30 MED ORDER — BUPIVACAINE-EPINEPHRINE (PF) 0.25% -1:200000 IJ SOLN
INTRAMUSCULAR | Status: DC | PRN
  Administered 2023-03-30: 30 mL

## 2023-03-30 MED ORDER — ATORVASTATIN CALCIUM 40 MG PO TABS
40.0000 mg | ORAL_TABLET | Freq: Every evening | ORAL | Status: DC
  Administered 2023-03-30: 40 mg via ORAL
  Filled 2023-03-30: qty 1

## 2023-03-30 MED ORDER — TRANEXAMIC ACID-NACL 1000-0.7 MG/100ML-% IV SOLN
1000.0000 mg | INTRAVENOUS | Status: AC
  Administered 2023-03-30: 1000 mg via INTRAVENOUS
  Filled 2023-03-30: qty 100

## 2023-03-30 MED ORDER — 0.9 % SODIUM CHLORIDE (POUR BTL) OPTIME
TOPICAL | Status: DC | PRN
  Administered 2023-03-30: 1000 mL

## 2023-03-30 MED ORDER — POVIDONE-IODINE 10 % EX SWAB: 2.0000 | Freq: Once | CUTANEOUS | Status: DC

## 2023-03-30 SURGICAL SUPPLY — 56 items
ADH SKN CLS APL DERMABOND .7 (GAUZE/BANDAGES/DRESSINGS) ×1
ATTUNE MED ANAT PAT 38 KNEE (Knees) IMPLANT
BAG COUNTER SPONGE SURGICOUNT (BAG) IMPLANT
BAG SPEC THK2 15X12 ZIP CLS (MISCELLANEOUS)
BAG SPNG CNTER NS LX DISP (BAG)
BAG ZIPLOCK 12X15 (MISCELLANEOUS) IMPLANT
BASE TIBIAL CEM ATTUNE SZ 7 (Knees) ×1 IMPLANT
BASEPLATE TIB CEM ATTUNE SZ7 (Knees) IMPLANT
BLADE SAW SGTL 13.0X1.19X90.0M (BLADE) ×1 IMPLANT
BNDG CMPR 6 X 5 YARDS HK CLSR (GAUZE/BANDAGES/DRESSINGS) ×1
BNDG ELASTIC 6INX 5YD STR LF (GAUZE/BANDAGES/DRESSINGS) ×1 IMPLANT
BOWL SMART MIX CTS (DISPOSABLE) ×1 IMPLANT
BSPLAT TIB 7 CMNT FX BRNG STRL (Knees) ×1 IMPLANT
CEMENT HV SMART SET (Cement) IMPLANT
COMP FEM CMT ATTUNE KNEE 7 RT (Joint) ×1 IMPLANT
COMPONENT FEM CMT ATTN KN 7 RT (Joint) IMPLANT
COVER SURGICAL LIGHT HANDLE (MISCELLANEOUS) ×1 IMPLANT
CUFF TOURN SGL QUICK 34 (TOURNIQUET CUFF) ×1
CUFF TRNQT CYL 34X4.125X (TOURNIQUET CUFF) ×1 IMPLANT
DERMABOND ADVANCED .7 DNX12 (GAUZE/BANDAGES/DRESSINGS) ×1 IMPLANT
DRAPE U-SHAPE 47X51 STRL (DRAPES) ×1 IMPLANT
DRESSING AQUACEL AG SP 3.5X10 (GAUZE/BANDAGES/DRESSINGS) ×1 IMPLANT
DRSG AQUACEL AG ADV 3.5X10 (GAUZE/BANDAGES/DRESSINGS) IMPLANT
DRSG AQUACEL AG SP 3.5X10 (GAUZE/BANDAGES/DRESSINGS) ×1
DURAPREP 26ML APPLICATOR (WOUND CARE) ×2 IMPLANT
ELECT REM PT RETURN 15FT ADLT (MISCELLANEOUS) ×1 IMPLANT
GLOVE BIO SURGEON STRL SZ 6 (GLOVE) ×1 IMPLANT
GLOVE BIOGEL PI IND STRL 6.5 (GLOVE) ×1 IMPLANT
GLOVE BIOGEL PI IND STRL 7.5 (GLOVE) ×1 IMPLANT
GLOVE ORTHO TXT STRL SZ7.5 (GLOVE) ×2 IMPLANT
GOWN STRL REUS W/ TWL LRG LVL3 (GOWN DISPOSABLE) ×2 IMPLANT
GOWN STRL REUS W/TWL LRG LVL3 (GOWN DISPOSABLE) ×2
HANDPIECE INTERPULSE COAX TIP (DISPOSABLE) ×1
HOLDER FOLEY CATH W/STRAP (MISCELLANEOUS) IMPLANT
INSERT TIB MED ATTUNE 7 7 RT (Insert) IMPLANT
KIT TURNOVER KIT A (KITS) IMPLANT
MANIFOLD NEPTUNE II (INSTRUMENTS) ×1 IMPLANT
NDL SAFETY ECLIP 18X1.5 (MISCELLANEOUS) IMPLANT
NS IRRIG 1000ML POUR BTL (IV SOLUTION) ×1 IMPLANT
PACK TOTAL KNEE CUSTOM (KITS) ×1 IMPLANT
PIN STEINMAN FIXATION KNEE (PIN) IMPLANT
PROTECTOR NERVE ULNAR (MISCELLANEOUS) ×1 IMPLANT
SET HNDPC FAN SPRY TIP SCT (DISPOSABLE) ×1 IMPLANT
SET PAD KNEE POSITIONER (MISCELLANEOUS) ×1 IMPLANT
SPIKE FLUID TRANSFER (MISCELLANEOUS) ×2 IMPLANT
SUT MNCRL AB 4-0 PS2 18 (SUTURE) ×1 IMPLANT
SUT STRATAFIX PDS+ 0 24IN (SUTURE) ×1 IMPLANT
SUT VIC AB 1 CT1 36 (SUTURE) ×1 IMPLANT
SUT VIC AB 2-0 CT1 27 (SUTURE) ×2
SUT VIC AB 2-0 CT1 TAPERPNT 27 (SUTURE) ×2 IMPLANT
SYR 3ML LL SCALE MARK (SYRINGE) ×1 IMPLANT
TOWEL GREEN STERILE FF (TOWEL DISPOSABLE) ×1 IMPLANT
TRAY FOLEY MTR SLVR 16FR STAT (SET/KITS/TRAYS/PACK) ×1 IMPLANT
TUBE SUCTION HIGH CAP CLEAR NV (SUCTIONS) ×1 IMPLANT
WATER STERILE IRR 1000ML POUR (IV SOLUTION) ×2 IMPLANT
WRAP KNEE MAXI GEL POST OP (GAUZE/BANDAGES/DRESSINGS) ×1 IMPLANT

## 2023-03-30 NOTE — Anesthesia Preprocedure Evaluation (Addendum)
Anesthesia Evaluation  Patient identified by MRN, date of birth, ID band Patient awake    Reviewed: Allergy & Precautions, NPO status , Patient's Chart, lab work & pertinent test results  History of Anesthesia Complications (+) DIFFICULT AIRWAY and history of anesthetic complications (last airway note: grade II view with glidescope)  Airway Mallampati: II  TM Distance: >3 FB Neck ROM: Full    Dental no notable dental hx. (+) Teeth Intact, Dental Advisory Given   Pulmonary sleep apnea , former smoker   Pulmonary exam normal breath sounds clear to auscultation       Cardiovascular hypertension, Pt. on medications Normal cardiovascular exam Rhythm:Regular Rate:Normal     Neuro/Psych negative neurological ROS  negative psych ROS   GI/Hepatic negative GI ROS, Neg liver ROS,,,  Endo/Other  negative endocrine ROS  Obese BMI 38  Renal/GU Renal InsufficiencyRenal diseaseLab Results      Component                Value               Date                      NA                       134 (L)             03/30/2023                CL                       106                 03/30/2023                K                        4.4                 03/30/2023                CO2                      21 (L)              03/19/2023                BUN                      81 (H)              03/30/2023                CREATININE               2.40 (H)            03/30/2023                GFRNONAA                 34 (L)              03/19/2023                CALCIUM                  9.3  03/19/2023                GLUCOSE                  117 (H)             03/30/2023             negative genitourinary   Musculoskeletal  (+) Arthritis ,    Abdominal   Peds  Hematology  (+) Blood dyscrasia, anemia Lab Results      Component                Value               Date                      WBC                      13.6 (H)             03/19/2023                HGB                      9.9 (L)             03/30/2023                HCT                      29.0 (L)            03/30/2023                MCV                      97.2                03/19/2023                PLT                      530 (H)             03/19/2023              Anesthesia Other Findings   Reproductive/Obstetrics                             Anesthesia Physical Anesthesia Plan  ASA: 3  Anesthesia Plan: Spinal and Regional   Post-op Pain Management: Regional block* and Tylenol PO (pre-op)*   Induction:   PONV Risk Score and Plan: 1 and Treatment may vary due to age or medical condition, Midazolam, Propofol infusion, Dexamethasone and Ondansetron  Airway Management Planned: Natural Airway  Additional Equipment:   Intra-op Plan:   Post-operative Plan:   Informed Consent: I have reviewed the patients History and Physical, chart, labs and discussed the procedure including the risks, benefits and alternatives for the proposed anesthesia with the patient or authorized representative who has indicated his/her understanding and acceptance.     Dental advisory given  Plan Discussed with: CRNA  Anesthesia Plan Comments:        Anesthesia Quick Evaluation

## 2023-03-30 NOTE — Interval H&P Note (Signed)
History and Physical Interval Note:  03/30/2023 8:29 AM  Dwayne Gomez  has presented today for surgery, with the diagnosis of Right knee osteoarthritis.  The various methods of treatment have been discussed with the patient and family. After consideration of risks, benefits and other options for treatment, the patient has consented to  Procedure(s): TOTAL KNEE ARTHROPLASTY (Right) as a surgical intervention.  The patient's history has been reviewed, patient examined, no change in status, stable for surgery.  I have reviewed the patient's chart and labs.  Questions were answered to the patient's satisfaction.     Shelda Pal

## 2023-03-30 NOTE — Progress Notes (Signed)
   03/30/23 2130  BiPAP/CPAP/SIPAP  $ Non-Invasive Ventilator  Non-Invasive Vent Set Up  $ Non-Invasive Home Ventilator  Initial  BiPAP/CPAP/SIPAP Pt Type Adult  BiPAP/CPAP/SIPAP Resmed  Mask Type Nasal mask  Mask Size Medium  EPAP  (8-20 per pt)  FiO2 (%) 21 %  Patient Home Equipment Yes (home mask)  Auto Titrate Yes

## 2023-03-30 NOTE — Anesthesia Postprocedure Evaluation (Signed)
Anesthesia Post Note  Patient: Dwayne Gomez  Procedure(s) Performed: TOTAL KNEE ARTHROPLASTY (Right: Knee)     Patient location during evaluation: PACU Anesthesia Type: Regional and Spinal Level of consciousness: oriented and awake and alert Pain management: pain level controlled Vital Signs Assessment: post-procedure vital signs reviewed and stable Respiratory status: spontaneous breathing, respiratory function stable and patient connected to nasal cannula oxygen Cardiovascular status: blood pressure returned to baseline and stable Postop Assessment: no headache, no backache and no apparent nausea or vomiting Anesthetic complications: no  No notable events documented.  Last Vitals:  Vitals:   03/30/23 1215 03/30/23 1230  BP: (!) 105/46 (!) 110/47  Pulse: 60 61  Resp: 18 13  Temp:  36.4 C  SpO2: 94% 93%    Last Pain:  Vitals:   03/30/23 1230  TempSrc:   PainSc: 0-No pain                 Dwayne Gomez

## 2023-03-30 NOTE — Discharge Instructions (Signed)

## 2023-03-30 NOTE — Anesthesia Procedure Notes (Signed)
Spinal  Patient location during procedure: OR Start time: 03/30/2023 9:40 AM End time: 03/30/2023 9:43 AM Reason for block: surgical anesthesia Staffing Performed: resident/CRNA  Resident/CRNA: [place], Kalon Erhardt C, CRNA Performed by: [place], Melane Windholz C, CRNA Authorized by: Elmer Picker, MD   Preanesthetic Checklist Completed: patient identified, IV checked, site marked, risks and benefits discussed, surgical consent, monitors and equipment checked, pre-op evaluation and timeout performed Spinal Block Patient position: sitting Prep: DuraPrep and site prepped and draped Patient monitoring: heart rate, cardiac monitor, continuous pulse ox and blood pressure Approach: midline Location: L3-4 Injection technique: single-shot Needle Needle type: Spinocan  Needle gauge: 24 G Needle length: 9 cm Assessment Sensory level: T4 Events: CSF return Additional Notes IV functioning, monitors applied to pt. Expiration date of kit checked and confirmed to be in date. Sterile prep and drape, hand hygiene and sterile gloved used. Pt was positioned and spine was prepped in sterile fashion. Skin was anesthetized with lidocaine. Free flow of clear CSF obtained prior to injecting local anesthetic into CSF x 1 attempt. Spinal needle aspirated freely following injection. Needle was carefully withdrawn, and pt tolerated procedure well. Loss of motor and sensory on exam post injection.

## 2023-03-30 NOTE — Evaluation (Signed)
Physical Therapy Evaluation Patient Details Name: Dwayne Gomez MRN: [id number] DOB: [birth year] Today's Date: 03/30/2023  History of Present Illness  72 yo male presents to therapy s/p R TKA on 03/30/2023 due to failure of conservative measures. 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  Clinical Impression      Dwayne Gomez is a 72 y.o. male POD 0 s/p R TKA. Patient reports IND with mobility at baseline. Patient is now limited by functional impairments (see PT problem list below) and requires CGA for bed mobility and CGA for transfers. Patient was able to ambulate 35 feet with RW and CGA level of assist recliner close due to R LE instability with weight acceptance. Patient instructed in exercise to facilitate ROM and circulation to manage edema. Patient will benefit from continued skilled PT interventions to address impairments and progress towards PLOF. Acute PT will follow to progress mobility and stair training in preparation for safe discharge home with family support and OPPT scheduled for 9/6.     If plan is discharge home, recommend the following: A little help with walking and/or transfers;A little help with bathing/dressing/bathroom;Assistance with cooking/housework;Help with stairs or ramp for entrance;Assist for transportation   Can travel by private vehicle        Equipment Recommendations Rolling walker (2 wheels)  Recommendations for Other Services       Functional Status Assessment Patient has had a recent decline in their functional status and demonstrates the ability to make significant improvements in function in a reasonable and predictable amount of time.     Precautions / Restrictions Precautions Precautions: Knee;Fall Restrictions Weight Bearing Restrictions: No      Mobility  Bed Mobility Overal bed mobility: Needs Assistance Bed Mobility: Supine to Sit     Supine to sit: Contact guard, HOB  elevated     General bed mobility comments: min cues    Transfers Overall transfer level: Needs assistance Equipment used: Rolling walker (2 wheels) Transfers: Sit to/from Stand Sit to Stand: Contact guard assist, From elevated surface           General transfer comment: min cues with pt able to push to stand pt indicated L LE is weak and needs TKA    Ambulation/Gait Ambulation/Gait assistance: Contact guard assist Gait Distance (Feet): 35 Feet Assistive device: Rolling walker (2 wheels) Gait Pattern/deviations: Step-to pattern, Antalgic, Trunk flexed Gait velocity: decreased     General Gait Details: heavy reliance on B UE support at RW due to R LE instability with weight acceptance pt ed provided on quad contraction when in stance phase to improve stability with decreased R buckling  Stairs            Wheelchair Mobility     Tilt Bed    Modified Rankin (Stroke Patients Only)       Balance Overall balance assessment: Needs assistance Sitting-balance support: Feet supported Sitting balance-Leahy Scale: Good     Standing balance support: Bilateral upper extremity supported, During functional activity, Reliant on assistive device for balance Standing balance-Leahy Scale: Poor                               Pertinent Vitals/Pain Pain Assessment Pain Assessment: 0-10 Pain Score: 7  Pain Location: R knee and leg Pain Descriptors / Indicators: Aching, Constant, Discomfort, Grimacing, Operative site guarding Pain Intervention(s): Limited activity within patient's tolerance, Monitored  during session, Premedicated before session, Repositioned, Ice applied    Home Living Family/patient expects to be discharged to:: Private residence Living Arrangements: Spouse/significant other Available Help at Discharge: Family Type of Home: House Home Access: Stairs to enter Entrance Stairs-Rails: Left Entrance Stairs-Number of Steps: 4   Home Layout: One  level Home Equipment: None      Prior Function Prior Level of Function : Independent/Modified Independent             Mobility Comments: IND with all ADLs, self care tasks, IADLS       Extremity/Trunk Assessment        Lower Extremity Assessment Lower Extremity Assessment: RLE deficits/detail RLE Deficits / Details: ankle DF 5/5, PF 4/4 and SLR > 10 degree lag with AA RLE Sensation: WNL (abn sensation R LE once in standing)    Cervical / Trunk Assessment Cervical / Trunk Assessment:  (wfl)  Communication   Communication Communication: No apparent difficulties  Cognition Arousal: Alert Behavior During Therapy: WFL for tasks assessed/performed Overall Cognitive Status: Within Functional Limits for tasks assessed                                          General Comments      Exercises Total Joint Exercises Ankle Circles/Pumps: AROM, Both, 15 reps   Assessment/Plan    PT Assessment Patient needs continued PT services  PT Problem List Decreased strength;Decreased range of motion;Decreased activity tolerance;Decreased balance;Decreased mobility;Decreased coordination;Pain       PT Treatment Interventions DME instruction;Gait training;Stair training;Functional mobility training;Therapeutic activities;Therapeutic exercise;Balance training;Neuromuscular re-education;Patient/family education;Modalities    PT Goals (Current goals can be found in the Care Plan section)  Acute Rehab PT Goals Patient Stated Goal: to be able to walk more normally get stronger and have the L TKA done PT Goal Formulation: With patient Time For Goal Achievement: 04/13/23 Potential to Achieve Goals: Good    Frequency 7X/week     Co-evaluation               AM-PAC PT "6 Clicks" Mobility  Outcome Measure Help needed turning from your back to your side while in a flat bed without using bedrails?: A Little Help needed moving from lying on your back to sitting on  the side of a flat bed without using bedrails?: A Little Help needed moving to and from a bed to a chair (including a wheelchair)?: A Little Help needed standing up from a chair using your arms (e.g., wheelchair or bedside chair)?: A Little Help needed to walk in hospital room?: A Little Help needed climbing 3-5 steps with a railing? : A Lot 6 Click Score: 17    End of Session Equipment Utilized During Treatment: Gait belt Activity Tolerance: Patient tolerated treatment well Patient left: in chair;with call bell/phone within reach;with nursing/sitter in room;with family/visitor present Nurse Communication: Mobility status (pain medications provided during eval) PT Visit Diagnosis: Unsteadiness on feet (R26.81);Other abnormalities of gait and mobility (R26.89);Muscle weakness (generalized) (M62.81);Difficulty in walking, not elsewhere classified (R26.2);Pain Pain - Right/Left: Right Pain - part of body: Leg;Knee    Time: 1191-4782 PT Time Calculation (min) (ACUTE ONLY): 20 min   Charges:   PT Evaluation $PT Eval Low Complexity: 1 Low   PT General Charges $$ ACUTE PT VISIT: 1 Visit         Johnny Bridge, PT Acute Rehab   Jacqualyn Posey  03/30/2023, 5:21 PM

## 2023-03-30 NOTE — H&P (Signed)
TOTAL KNEE ADMISSION H&P  Patient is being admitted for right total knee arthroplasty.  Therapy Plans: outpatient therapy at [facility] Disposition: Home with wife Planned DVT Prophylaxis: aspirin 81mg  BID DME needed: walker PCP: Chilton Greathouse PA-C - clearance received Nephrologist: (CKD III) TXA: IV Allergies: NKDA Anesthesia Concerns: none BMI: 38.9 Last HgbA1c: 6.1%   Other: - Hx of right total shoulder by Dr. Rennis Chris this year -- did well - oxycodone, robaxin, tylenol - No hx of VTE or cancer - CKD > Cr. ~2 -- no NSAIDs  Subjective:  Chief Complaint:right knee pain.  HPI: Dwayne Gomez, 72 y.o. male, has a history of pain and functional disability in the right knee due to arthritis and has failed non-surgical conservative treatments for greater than 12 weeks to includeNSAID's and/or analgesics and corticosteriod injections.  Onset of symptoms was gradual, starting 2 years ago with gradually worsening course since that time. The patient noted no past surgery on the right knee(s).  Patient currently rates pain in the right knee(s) at 7 out of 10 with activity. Patient has worsening of pain with activity and weight bearing and pain that interferes with activities of daily living.  Patient has evidence of joint space narrowing by imaging studies.  There is no active infection.  Patient Active Problem List   Diagnosis Date Noted   OSA (obstructive sleep apnea) 02/09/2021   Morbid obesity due to excess calories (HCC) 02/09/2021   Past Medical History:  Diagnosis Date   Anemia    Arthritis    gout   Chronic kidney disease    Family history of adverse reaction to anesthesia    Father- hard time waking up after anesthesia   Gout    High cholesterol    Hypertension    Pre-diabetes     Past Surgical History:  Procedure Laterality Date   APPENDECTOMY  03/28/1967   CATARACT EXTRACTION Bilateral    COLONOSCOPY     REVERSE SHOULDER ARTHROPLASTY Right 10/02/2022    Procedure: REVERSE SHOULDER ARTHROPLASTY;  Surgeon: Francena Hanly, MD;  Location: [facility];  Service: Orthopedics;  Laterality: Right;     No current facility-administered medications for this encounter.   Current Outpatient Medications  Medication Sig Dispense Refill Last Dose   acetaminophen (TYLENOL) 500 MG tablet Take 1,000 mg by mouth every 6 (six) hours as needed for moderate pain.      allopurinol (ZYLOPRIM) 300 MG tablet Take 300 mg by mouth daily.      atorvastatin (LIPITOR) 40 MG tablet Take 40 mg by mouth every evening.      diltiazem (CARDIZEM CD) 360 MG 24 hr capsule Take 360 mg by mouth daily.      furosemide (LASIX) 40 MG tablet Take 40 mg by mouth 2 (two) times daily as needed for fluid or edema.      hydrALAZINE (APRESOLINE) 25 MG tablet Take 50 mg by mouth 2 (two) times daily.      NON FORMULARY Pt uses a cpap nightly      Omega-3 Fatty Acids (FISH OIL) 1000 MG CAPS Take 1,000 mg by mouth daily.      sodium bicarbonate 650 MG tablet Take 650 mg by mouth 2 (two) times daily.      spironolactone (ALDACTONE) 25 MG tablet Take 25 mg by mouth 2 (two) times daily.      TOUJEO SOLOSTAR 300 UNIT/ML Solostar Pen Inject 55 Units into the skin every evening.      TURMERIC CURCUMIN PO  Take 1,000 mg by mouth daily.      Vitamin D, Ergocalciferol, (DRISDOL) 1.25 MG (50000 UNIT) CAPS capsule Take 50,000 Units by mouth 3 (three) times a week.      cyclobenzaprine (FLEXERIL) 10 MG tablet Take 1 tablet (10 mg total) by mouth 3 (three) times daily as needed for muscle spasms. (Patient not taking: Reported on 03/12/2023) 30 tablet 1 Not Taking   ondansetron (ZOFRAN) 4 MG tablet Take 1 tablet (4 mg total) by mouth every 8 (eight) hours as needed for nausea or vomiting. (Patient not taking: Reported on 03/12/2023) 10 tablet 0 Not Taking   oxyCODONE-acetaminophen (PERCOCET) 5-325 MG tablet Take 1 tablet by mouth every 4 (four) hours as needed (max 6 q). (Patient not taking: Reported on 03/12/2023)  20 tablet 0 Not Taking   Allergies  Allergen Reactions   Ace Inhibitors Other (See Comments)    ACUTE KIDNEY INJURY AND HYPERKALEMIA    Fenofibrate     Interferes with kidneys    Social History   Tobacco Use   Smoking status: Former    Current packs/day: 0.00    Average packs/day: 1 pack/day for 3.0 years (3.0 ttl pk-yrs)    Types: Cigarettes    Start date: 39    Quit date: 07/28/1975    Years since quitting: 47.7   Smokeless tobacco: Never  Substance Use Topics   Alcohol use: Not Currently    Family History  Problem Relation Age of Onset   Cancer Mother        breast   Cancer Father        prostate   Diabetes Father      Review of Systems  Constitutional:  Negative for chills and fever.  Respiratory:  Negative for cough and shortness of breath.   Cardiovascular:  Negative for chest pain.  Gastrointestinal:  Negative for nausea and vomiting.  Musculoskeletal:  Positive for arthralgias.     Objective:  Physical Exam Well nourished and well developed. General: Alert and oriented x3, cooperative and pleasant, no acute distress. Head: normocephalic, atraumatic, neck supple. Eyes: EOMI.  Musculoskeletal: Bilateral knee exams: No palpable effusions, warmth erythema Slight flexion contractures associated with bilateral genu varum Flexion with tightness over 100 degrees No significant lower extremity edema, erythema or calf tenderness   Calves soft and nontender. Motor function intact in LE. Strength 5/5 LE bilaterally. Neuro: Distal pulses 2+. Sensation to light touch intact in LE.  Vital signs in last 24 hours:    Labs:   Estimated body mass index is 38.45 kg/m as calculated from the following:   Height as of 03/19/23: 5\' 10"  (1.778 m).   Weight as of 03/19/23: 121.6 kg.   Imaging Review Plain radiographs demonstrate severe degenerative joint disease of the right knee(s). The overall alignment isneutral. The bone quality appears to be adequate for age  and reported activity level.      Assessment/Plan:  End stage arthritis, right knee   The patient history, physical examination, clinical judgment of the provider and imaging studies are consistent with end stage degenerative joint disease of the right knee(s) and total knee arthroplasty is deemed medically necessary. The treatment options including medical management, injection therapy arthroscopy and arthroplasty were discussed at length. The risks and benefits of total knee arthroplasty were presented and reviewed. The risks due to aseptic loosening, infection, stiffness, patella tracking problems, thromboembolic complications and other imponderables were discussed. The patient acknowledged the explanation, agreed to proceed with the plan and consent  was signed. Patient is being admitted for inpatient treatment for surgery, pain control, PT, OT, prophylactic antibiotics, VTE prophylaxis, progressive ambulation and ADL's and discharge planning. The patient is planning to be discharged  home.     Patient's anticipated LOS is less than 2 midnights, meeting these requirements: - Younger than 14 - Lives within 1 hour of care - Has a competent adult at home to recover with post-op recover - NO history of  - Chronic pain requiring opiods  - Diabetes  - Coronary Artery Disease  - Heart failure  - Heart attack  - Stroke  - DVT/VTE  - Cardiac arrhythmia  - Respiratory Failure/COPD  - Renal failure  - Anemia  - Advanced Liver disease   Rosalene Billings, PA-C Orthopedic Surgery [facility] [place] [phone]

## 2023-03-30 NOTE — Transfer of Care (Signed)
Immediate Anesthesia Transfer of Care Note  Patient: Dwayne Gomez  Procedure(s) Performed: TOTAL KNEE ARTHROPLASTY (Right: Knee)  Patient Location: PACU  Anesthesia Type:Spinal and MAC combined with regional for post-op pain  Level of Consciousness: awake, alert , and oriented  Airway & Oxygen Therapy: Patient Spontanous Breathing and Patient connected to face mask oxygen  Post-op Assessment: Report given to RN and Post -op Vital signs reviewed and stable  Post vital signs: Reviewed and stable  Last Vitals:  Vitals Value Taken Time  BP 103/41 03/30/23 1125  Temp    Pulse 68 03/30/23 1126  Resp 15 03/30/23 1126  SpO2 98 % 03/30/23 1126  Vitals shown include unfiled device data.  Last Pain:  Vitals:   03/30/23 0740  TempSrc: Oral         Complications: No notable events documented.

## 2023-03-30 NOTE — Anesthesia Procedure Notes (Signed)
Anesthesia Regional Block: Adductor canal block   Pre-Anesthetic Checklist: , timeout performed,  Correct Patient, Correct Site, Correct Laterality,  Correct Procedure, Correct Position, site marked,  Risks and benefits discussed,  Pre-op evaluation,  At surgeon's request and post-op pain management  Laterality: Right  Prep: Maximum Sterile Barrier Precautions used, chloraprep       Needles:  Injection technique: Single-shot  Needle Type: Echogenic Stimulator Needle     Needle Length: 9cm  Needle Gauge: 21     Additional Needles:   Procedures:,,,, ultrasound used (permanent image in chart),,    Narrative:  Start time: 03/30/2023 9:00 AM End time: 03/30/2023 9:02 AM Injection made incrementally with aspirations every 5 mL. Anesthesiologist: Elmer Picker, MD

## 2023-03-30 NOTE — Op Note (Signed)
NAME:  Dwayne Gomez                      MEDICAL RECORD NO.:  [id number]                             FACILITY:  [facility]      PHYSICIAN:  Madlyn Frankel. Charlann Boxer, M.D.  DATE OF BIRTH:  [birth year]      DATE OF PROCEDURE:  03/30/2023                                     OPERATIVE REPORT         PREOPERATIVE DIAGNOSIS:  Right knee osteoarthritis.      POSTOPERATIVE DIAGNOSIS:  Right knee osteoarthritis.      FINDINGS:  The patient was noted to have complete loss of cartilage and   bone-on-bone arthritis with associated osteophytes in the medial and patellofemoral compartments of   the knee with a significant synovitis and associated effusion.  The patient had failed months of conservative treatment including medications, injection therapy, activity modification.     PROCEDURE:  Right total knee replacement.      COMPONENTS USED:  DePuy Attune FB CR MS knee   system, a size 7 femur, 7 tibia, size 7 mm CR MS AOX insert, and 38 anatomic patellar   button.      SURGEON:  Madlyn Frankel. Charlann Boxer, M.D.      ASSISTANT:  Rosalene Billings, PA-C.      ANESTHESIA:  Regional and Spinal.      SPECIMENS:  None.      COMPLICATION:  None.      DRAINS:  None.  EBL: <200 cc      TOURNIQUET TIME:  29 min at 225 mmHg     The patient was stable to the recovery room.      INDICATION FOR PROCEDURE:  Dwayne Gomez is a 72 y.o. male patient of   mine.  The patient had been seen, evaluated, and treated for months conservatively in the   office with medication, activity modification, and injections.  The patient had   radiographic changes of bone-on-bone arthritis with endplate sclerosis and osteophytes noted.  Based on the radiographic changes and failed conservative measures, the patient   decided to proceed with definitive treatment, total knee replacement.  Risks of infection, DVT, component failure, need for revision surgery, neurovascular injury were reviewed in the office setting.  The postop course was  reviewed stressing the efforts to maximize post-operative satisfaction and function.  Consent was obtained for benefit of pain   relief.      PROCEDURE IN DETAIL:  The patient was brought to the operative theater.   Once adequate anesthesia, preoperative antibiotics, 2 gm of Ancef,1 gm of Tranexamic Acid, and 10 mg of Decadron administered, the patient was positioned supine with a right thigh tourniquet placed.  The  right lower extremity was prepped and draped in sterile fashion.  A time-   out was performed identifying the patient, planned procedure, and the appropriate extremity.      The right lower extremity was placed in the [facility] leg holder.  The leg was   exsanguinated, tourniquet elevated to 225 mmHg.  A midline incision was   made followed by median parapatellar arthrotomy.  Following initial   exposure, attention was  first directed to the patella.  Precut   measurement was noted to be 26 mm.  I resected down to 14 mm and used a   38 anatomic patellar button to restore patellar height as well as cover the cut surface.      The lug holes were drilled and a metal shim was placed to protect the   patella from retractors and saw blade during the procedure.      At this point, attention was now directed to the femur.  The femoral   canal was opened with a drill, irrigated to try to prevent fat emboli.  An   intramedullary rod was passed at 5 degrees valgus, 9 mm of bone was   resected off the distal femur.  Following this resection, the tibia was   subluxated anteriorly.  Using the extramedullary guide, 2 mm of bone was resected off   the proximal medial tibia.  We confirmed the gap would be   stable medially and laterally with a size 5 spacer block as well as confirmed that the tibial cut was perpendicular in the coronal plane, checking with an alignment rod.      Once this was done, I sized the femur to be a size 7 in the anterior-   posterior dimension, chose a standard component  based on medial and   lateral dimension.  The size 7 rotation block was then pinned in   position anterior referenced using the C-clamp to set rotation.  The   anterior, posterior, and  chamfer cuts were made without difficulty nor   notching making certain that I was along the anterior cortex to help   with flexion gap stability.      The final box cut was made off the lateral aspect of distal femur.      At this point, the tibia was sized to be a size 7.  The size 7 tray was   then pinned in position through the medial third of the tubercle,   drilled, and keel punched.  Trial reduction was now carried with a 7 femur,  7 tibia, a size 7 mm CR MS insert, and the 38 anatomic patella botton.  The knee was brought to full extension with good flexion stability with the patella   tracking through the trochlea without application of pressure.  Given   all these findings the trial components removed.  Final components were   opened and cement was mixed.  The knee was irrigated with normal saline solution and pulse lavage.  The synovial lining was   then injected with 30 cc of 0.25% Marcaine with epinephrine, 1 cc of Toradol and 30 cc of NS for a total of 61 cc.     Final implants were then cemented onto cleaned and dried cut surfaces of bone with the knee brought to extension with a size 7 mm CR MS trial insert.      Once the cement had fully cured, excess cement was removed   throughout the knee.  I confirmed that I was satisfied with the range of   motion and stability, and the final size 7 mm CR MS AOX insert was chosen.  It was   placed into the knee.      The tourniquet had been let down at 29 minutes.  No significant   hemostasis was required.  The extensor mechanism was then reapproximated using #1 Vicryl and #1 Stratafix sutures with the knee   in flexion.  The   remaining wound was closed with 2-0 Vicryl and running 4-0 Monocryl.   The knee was cleaned, dried, dressed sterilely using  Dermabond and   Aquacel dressing.  The patient was then   brought to recovery room in stable condition, tolerating the procedure   well.   Please note that Physician Assistant, Rosalene Billings, PA-C was present for the entirety of the case, and was utilized for pre-operative positioning, peri-operative retractor management, general facilitation of the procedure and for primary wound closure at the end of the case.              Madlyn Frankel Charlann Boxer, M.D.    03/30/2023 8:29 AM

## 2023-03-31 ENCOUNTER — Encounter (HOSPITAL_COMMUNITY): Payer: Self-pay | Admitting: Orthopedic Surgery

## 2023-03-31 ENCOUNTER — Other Ambulatory Visit: Payer: Self-pay

## 2023-03-31 DIAGNOSIS — M1711 Unilateral primary osteoarthritis, right knee: Secondary | ICD-10-CM | POA: Diagnosis not present

## 2023-03-31 LAB — CBC
HCT: 30.2 % — ABNORMAL LOW (ref 39.0–52.0)
Hemoglobin: 9.8 g/dL — ABNORMAL LOW (ref 13.0–17.0)
MCH: 31.2 pg (ref 26.0–34.0)
MCHC: 32.5 g/dL (ref 30.0–36.0)
MCV: 96.2 fL (ref 80.0–100.0)
Platelets: 368 10*3/uL (ref 150–400)
RBC: 3.14 MIL/uL — ABNORMAL LOW (ref 4.22–5.81)
RDW: 14.6 % (ref 11.5–15.5)
WBC: 15.8 10*3/uL — ABNORMAL HIGH (ref 4.0–10.5)
nRBC: 0 % (ref 0.0–0.2)

## 2023-03-31 LAB — BASIC METABOLIC PANEL
Anion gap: 9 (ref 5–15)
BUN: 61 mg/dL — ABNORMAL HIGH (ref 8–23)
CO2: 19 mmol/L — ABNORMAL LOW (ref 22–32)
Calcium: 8.5 mg/dL — ABNORMAL LOW (ref 8.9–10.3)
Chloride: 108 mmol/L (ref 98–111)
Creatinine, Ser: 1.78 mg/dL — ABNORMAL HIGH (ref 0.61–1.24)
GFR, Estimated: 41 mL/min — ABNORMAL LOW (ref >60)
Glucose, Bld: 159 mg/dL — ABNORMAL HIGH (ref 70–99)
Potassium: 5.2 mmol/L — ABNORMAL HIGH (ref 3.5–5.1)
Sodium: 136 mmol/L (ref 135–145)

## 2023-03-31 LAB — GLUCOSE, CAPILLARY: Glucose-Capillary: 174 mg/dL — ABNORMAL HIGH (ref 70–99)

## 2023-03-31 MED ORDER — METHOCARBAMOL 500 MG PO TABS: 500.0000 mg | ORAL_TABLET | Freq: Four times a day (QID) | ORAL | 2 refills | Status: DC | PRN

## 2023-03-31 MED ORDER — POLYETHYLENE GLYCOL 3350 17 G PO PACK: 17.0000 g | PACK | Freq: Two times a day (BID) | ORAL | 0 refills | Status: DC

## 2023-03-31 MED ORDER — OXYCODONE HCL 5 MG PO TABS: 5.0000 mg | ORAL_TABLET | ORAL | 0 refills | Status: DC | PRN

## 2023-03-31 MED ORDER — SENNA 8.6 MG PO TABS: 2.0000 | ORAL_TABLET | Freq: Every day | ORAL | 0 refills | Status: DC

## 2023-03-31 MED ORDER — ASPIRIN 81 MG PO CHEW: 81.0000 mg | CHEWABLE_TABLET | Freq: Two times a day (BID) | ORAL | 0 refills | Status: AC

## 2023-03-31 NOTE — Progress Notes (Signed)
Subjective: 1 Day Post-Op Procedure(s) (LRB): TOTAL KNEE ARTHROPLASTY (Right) Patient reports pain as mild.   Patient seen in rounds by Dr. Charlann Boxer. Patient is well, and has had no acute complaints or problems. No acute events overnight. Foley catheter removed. Patient ambulated 35 feet with PT.  We will start therapy today.   Objective: Vital signs in last 24 hours: Temp:  [97.2 F (36.2 C)-98.5 F (36.9 C)] 98.5 F (36.9 C) (09/04 0526) Pulse Rate:  [54-72] 71 (09/04 0526) Resp:  [13-20] 16 (09/04 0526) BP: (89-143)/(41-64) 128/63 (09/04 0526) SpO2:  [93 %-100 %] 99 % (09/04 0526) FiO2 (%):  [21 %] 21 % (09/03 2130)  Intake/Output from previous day:  Intake/Output Summary (Last 24 hours) at 03/31/2023 0831 Last data filed at 03/31/2023 0525 Gross per 24 hour  Intake 3899.02 ml  Output 3975 ml  Net -75.98 ml     Intake/Output this shift: No intake/output data recorded.  Labs: Recent Labs    03/30/23 0811 03/31/23 0349  HGB 9.9* 9.8*   Recent Labs    03/30/23 0811 03/31/23 0349  WBC  --  15.8*  RBC  --  3.14*  HCT 29.0* 30.2*  PLT  --  368   Recent Labs    03/30/23 0811 03/31/23 0349  NA 134* 136  K 4.4 5.2*  CL 106 108  CO2  --  19*  BUN 81* 61*  CREATININE 2.40* 1.78*  GLUCOSE 117* 159*  CALCIUM  --  8.5*   No results for input(s): "LABPT", "INR" in the last 72 hours.  Exam: General - Patient is Alert and Oriented Extremity - Neurologically intact Sensation intact distally Intact pulses distally Dorsiflexion/Plantar flexion intact Dressing - dressing C/D/I Motor Function - intact, moving foot and toes well on exam.   Past Medical History:  Diagnosis Date   Anemia    Arthritis    gout   Chronic kidney disease    Family history of adverse reaction to anesthesia    Father- hard time waking up after anesthesia   Gout    High cholesterol    Hypertension    Pre-diabetes     Assessment/Plan: 1 Day Post-Op Procedure(s) (LRB): TOTAL KNEE  ARTHROPLASTY (Right) Principal Problem:   S/P total knee arthroplasty, right  Estimated body mass index is 38.45 kg/m as calculated from the following:   Height as of this encounter: 5\' 10"  (1.778 m).   Weight as of this encounter: 121.6 kg. Advance diet Up with therapy D/C IV fluids   Patient's anticipated LOS is less than 2 midnights, meeting these requirements: - Younger than 57 - Lives within 1 hour of care - Has a competent adult at home to recover with post-op recover - NO history of  - Chronic pain requiring opiods  - Diabetes  - Coronary Artery Disease  - Heart failure  - Heart attack  - Stroke  - DVT/VTE  - Cardiac arrhythmia  - Respiratory Failure/COPD  - Renal failure  - Anemia  - Advanced Liver disease     DVT Prophylaxis - Aspirin Weight bearing as tolerated.  Hgb stable at 9.8 this AM. Hx of CKD - Cr 1.78 - baseline around 2   Plan is to go Home after hospital stay. Plan for discharge today following 1-2 sessions of PT as long as they are meeting their goals. Patient is scheduled for OPPT. Follow up in the office in 2 weeks.   Rosalene Billings, PA-C Orthopedic Surgery [phone] 03/31/2023, 8:31  AM

## 2023-03-31 NOTE — Care Management Obs Status (Signed)
MEDICARE OBSERVATION STATUS NOTIFICATION   Patient Details  Name: Dwayne Gomez MRN: [id number] Date of Birth: [birth year]   Medicare Observation Status Notification Given:  Yes    Amada Jupiter, LCSW 03/31/2023, 11:29 AM

## 2023-03-31 NOTE — Plan of Care (Signed)
  Problem: Coping: Goal: Ability to adjust to condition or change in health will improve Outcome: Progressing   Problem: Pain Management: Goal: Pain level will decrease with appropriate interventions Outcome: Progressing   

## 2023-03-31 NOTE — Progress Notes (Signed)
Physical Therapy Treatment Patient Details Name: Dwayne Gomez MRN: [id number] DOB: June 23, 1951 Today's Date: 03/31/2023   History of Present Illness 72 yo male presents to therapy s/p R TKA on 03/30/2023 due to failure of conservative measures. 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    PT Comments  The patient is progressing well. Patient ambulated x 150' using RW, practiced steps with rail nd cane. Patient has met goals to Dc home.   If plan is discharge home, recommend the following: A little help with walking and/or transfers;A little help with bathing/dressing/bathroom;Assistance with cooking/housework;Help with stairs or ramp for entrance;Assist for transportation   Can travel by private vehicle        Equipment Recommendations  Rolling walker (2 wheels)    Recommendations for Other Services       Precautions / Restrictions Precautions Precautions: Knee;Fall Restrictions Weight Bearing Restrictions: No     Mobility  Bed Mobility Overal bed mobility: Modified Independent                  Transfers   Equipment used: Rolling walker (2 wheels) Transfers: Sit to/from Stand Sit to Stand: Supervision           General transfer comment: min cues with pt able to push to stand pt indicated L LE is weak and needs TKA    Ambulation/Gait Ambulation/Gait assistance: Contact guard assist Gait Distance (Feet): 150 Feet Assistive device: Rolling walker (2 wheels) Gait Pattern/deviations: Step-to pattern, Antalgic Gait velocity: decreased     General Gait Details: gait smoothe, step to   Stairs Stairs: Yes Stairs assistance: Min assist Stair Management: One rail Left, Step to pattern, Forwards, With cane Number of Stairs: 2 General stair comments: cues for sequence,patient has  been using right as lead leg   Wheelchair Mobility     Tilt Bed    Modified Rankin (Stroke Patients Only)        Balance Overall balance assessment: Mild deficits observed, not formally tested                                          Cognition Arousal: Alert                                              Exercises Total Joint Exercises Ankle Circles/Pumps: AROM, Both, 10 reps Quad Sets: AROM, Both, 10 reps Heel Slides: AROM, Right, 5 reps Straight Leg Raises: AROM, Right, 5 reps Long Arc Quad: AROM, Right, 5 reps Knee Flexion: AROM, Right, 5 reps Goniometric ROM: 5-90 right knee flex    General Comments        Pertinent Vitals/Pain Pain Assessment Pain Score: 3  Pain Location: R knee and leg Pain Descriptors / Indicators: Discomfort Pain Intervention(s): Monitored during session, Premedicated before session    Home Living                          Prior Function            PT Goals (current goals can now be found in the care plan section) Progress towards PT goals: Progressing toward goals    Frequency    7X/week  PT Plan      Co-evaluation              AM-PAC PT "6 Clicks" Mobility   Outcome Measure  Help needed turning from your back to your side while in a flat bed without using bedrails?: None Help needed moving from lying on your back to sitting on the side of a flat bed without using bedrails?: None Help needed moving to and from a bed to a chair (including a wheelchair)?: None Help needed standing up from a chair using your arms (e.g., wheelchair or bedside chair)?: A Little Help needed to walk in hospital room?: A Little Help needed climbing 3-5 steps with a railing? : A Little 6 Click Score: 21    End of Session Equipment Utilized During Treatment: Gait belt Activity Tolerance: Patient tolerated treatment well Patient left: in chair;with call bell/phone within reach;with nursing/sitter in room;with family/visitor present Nurse Communication: Mobility status PT Visit Diagnosis: Unsteadiness on feet  (R26.81);Other abnormalities of gait and mobility (R26.89);Muscle weakness (generalized) (M62.81);Difficulty in walking, not elsewhere classified (R26.2);Pain Pain - Right/Left: Right Pain - part of body: Knee     Time: 1001-1045 PT Time Calculation (min) (ACUTE ONLY): 44 min  Charges:    $Gait Training: 8-22 mins $Therapeutic Exercise: 8-22 mins $Self Care/Home Management: 8-22 PT General Charges $$ ACUTE PT VISIT: 1 Visit                     Blanchard Kelch PT Acute Rehabilitation Services Office [phone] Weekend pager-[phone]    Rada Hay 03/31/2023, 10:55 AM

## 2023-03-31 NOTE — TOC Transition Note (Signed)
Transition of Care [facility]) - CM/SW Discharge Note   Patient Details  Name: Dwayne Gomez MRN: [id number] Date of Birth: 10/01/1950  [facility] of [facility]) CM/SW Contact:  Amada Jupiter, LCSW Phone Number: 03/31/2023, 11:31 AM   Clinical Narrative:     Met with pt and confirming he has received RW to room via [employer].  OPPT already arranged with [facility] Silvestre Gunner).  No further TOC needs.  Final next level of care: OP Rehab Barriers to Discharge: No Barriers Identified   Patient Goals and CMS Choice      Discharge Placement                         Discharge Plan and Services Additional resources added to the After Visit Summary for                  DME Arranged: Walker rolling DME Agency: [employer]                  Social Determinants of Health (SDOH) Interventions SDOH Screenings   Food Insecurity: No Food Insecurity (03/31/2023)  Housing: Low Risk  (03/31/2023)  Transportation Needs: No Transportation Needs (03/31/2023)  Utilities: Not At Risk (03/31/2023)  Tobacco Use: Medium Risk (03/30/2023)     Readmission Risk Interventions     No data to display

## 2023-04-12 NOTE — Discharge Summary (Signed)
Patient ID: Dwayne Gomez MRN: [id number] DOB/AGE: [birth year] 72 y.o.  Admit date: 03/30/2023 Discharge date: 03/31/2023  Admission Diagnoses:  Right knee osteoarthritis  Discharge Diagnoses:  Principal Problem:   S/P total knee arthroplasty, right   Past Medical History:  Diagnosis Date   Anemia    Arthritis    gout   Chronic kidney disease    Family history of adverse reaction to anesthesia    Father- hard time waking up after anesthesia   Gout    High cholesterol    Hypertension    Pre-diabetes     Surgeries: Procedure(s): TOTAL KNEE ARTHROPLASTY on 03/30/2023   Consultants:   Discharged Condition: Improved  Hospital Course: Dwayne Gomez is an 72 y.o. male who was admitted 03/30/2023 for operative treatment ofS/P total knee arthroplasty, right. Patient has severe unremitting pain that affects sleep, daily activities, and work/hobbies. After pre-op clearance the patient was taken to the operating room on 03/30/2023 and underwent  Procedure(s): TOTAL KNEE ARTHROPLASTY.    Patient was given perioperative antibiotics:  Anti-infectives (From admission, onward)    Start     Dose/Rate Route Frequency Ordered Stop   03/30/23 1600  ceFAZolin (ANCEF) IVPB 2g/100 mL premix        2 g 200 mL/hr over 30 Minutes Intravenous Every 6 hours 03/30/23 1450 03/30/23 2211   03/30/23 0752  ceFAZolin (ANCEF) 3-0.9 GM/100ML-% IVPB       Note to Pharmacy: Vevelyn Royals D: cabinet override      03/30/23 0752 03/30/23 0949   03/30/23 0730  ceFAZolin (ANCEF) IVPB 3g/100 mL premix        3 g 200 mL/hr over 30 Minutes Intravenous On call to O.R. 03/30/23 0347 03/30/23 4259        Patient was given sequential compression devices, early ambulation, and chemoprophylaxis to prevent DVT. Patient worked with PT and was meeting their goals regarding safe ambulation and transfers.  Patient benefited maximally from hospital stay and there were no complications.    Recent vital signs: No  data found.   Recent laboratory studies: No results for input(s): "WBC", "HGB", "HCT", "PLT", "NA", "K", "CL", "CO2", "BUN", "CREATININE", "GLUCOSE", "INR", "CALCIUM" in the last 72 hours.  Invalid input(s): "PT", "2"   Discharge Medications:   Allergies as of 03/31/2023       Reactions   Ace Inhibitors Other (See Comments)   ACUTE KIDNEY INJURY AND HYPERKALEMIA    Fenofibrate    Interferes with kidneys        Medication List     STOP taking these medications    cyclobenzaprine 10 MG tablet Commonly known as: FLEXERIL   ondansetron 4 MG tablet Commonly known as: Zofran   oxyCODONE-acetaminophen 5-325 MG tablet Commonly known as: Percocet       TAKE these medications    acetaminophen 500 MG tablet Commonly known as: TYLENOL Take 1,000 mg by mouth every 6 (six) hours as needed for moderate pain.   allopurinol 300 MG tablet Commonly known as: ZYLOPRIM Take 300 mg by mouth daily.   aspirin 81 MG chewable tablet Chew 1 tablet (81 mg total) by mouth 2 (two) times daily for 28 days.   atorvastatin 40 MG tablet Commonly known as: LIPITOR Take 40 mg by mouth every evening.   diltiazem 360 MG 24 hr capsule Commonly known as: CARDIZEM CD Take 360 mg by mouth daily.   Fish Oil 1000 MG Caps Take 1,000 mg by mouth daily.   furosemide 40  MG tablet Commonly known as: LASIX Take 40 mg by mouth 2 (two) times daily as needed for fluid or edema.   hydrALAZINE 25 MG tablet Commonly known as: APRESOLINE Take 50 mg by mouth 2 (two) times daily.   methocarbamol 500 MG tablet Commonly known as: ROBAXIN Take 1 tablet (500 mg total) by mouth every 6 (six) hours as needed for muscle spasms.   NON FORMULARY Pt uses a cpap nightly   oxyCODONE 5 MG immediate release tablet Commonly known as: Oxy IR/ROXICODONE Take 1 tablet (5 mg total) by mouth every 4 (four) hours as needed for severe pain.   polyethylene glycol 17 g packet Commonly known as: MIRALAX / GLYCOLAX Take  17 g by mouth 2 (two) times daily.   senna 8.6 MG Tabs tablet Commonly known as: SENOKOT Take 2 tablets (17.2 mg total) by mouth at bedtime.   sodium bicarbonate 650 MG tablet Take 650 mg by mouth 2 (two) times daily.   spironolactone 25 MG tablet Commonly known as: ALDACTONE Take 25 mg by mouth 2 (two) times daily.   Toujeo SoloStar 300 UNIT/ML Solostar Pen Generic drug: insulin glargine (1 Unit Dial) Inject 55 Units into the skin every evening.   TURMERIC CURCUMIN PO Take 1,000 mg by mouth daily.   Vitamin D (Ergocalciferol) 1.25 MG (50000 UNIT) Caps capsule Commonly known as: DRISDOL Take 50,000 Units by mouth 3 (three) times a week.               Discharge Care Instructions  (From admission, onward)           Start     Ordered   03/31/23 0000  Change dressing       Comments: Maintain surgical dressing until follow up in the clinic. If the edges start to pull up, may reinforce with tape. If the dressing is no longer working, may remove and cover with gauze and tape, but must keep the area dry and clean.  Call with any questions or concerns.   03/31/23 0837            Diagnostic Studies: No results found.  Disposition: Discharge disposition: 01-Home or Self Care       Discharge Instructions     Call MD / Call 911   Complete by: As directed    If you experience chest pain or shortness of breath, CALL 911 and be transported to the hospital emergency room.  If you develope a fever above 101 F, pus (white drainage) or increased drainage or redness at the wound, or calf pain, call your surgeon's office.   Change dressing   Complete by: As directed    Maintain surgical dressing until follow up in the clinic. If the edges start to pull up, may reinforce with tape. If the dressing is no longer working, may remove and cover with gauze and tape, but must keep the area dry and clean.  Call with any questions or concerns.   Constipation Prevention   Complete  by: As directed    Drink plenty of fluids.  Prune juice may be helpful.  You may use a stool softener, such as Colace (over the counter) 100 mg twice a day.  Use MiraLax (over the counter) for constipation as needed.   Diet - low sodium heart healthy   Complete by: As directed    Increase activity slowly as tolerated   Complete by: As directed    Weight bearing as tolerated with assist device (walker, cane,  etc) as directed, use it as long as suggested by your surgeon or therapist, typically at least 4-6 weeks.   Post-operative opioid taper instructions:   Complete by: As directed    POST-OPERATIVE OPIOID TAPER INSTRUCTIONS: It is important to wean off of your opioid medication as soon as possible. If you do not need pain medication after your surgery it is ok to stop day one. Opioids include: Codeine, Hydrocodone(Norco, Vicodin), Oxycodone(Percocet, oxycontin) and hydromorphone amongst others.  Long term and even short term use of opiods can cause: Increased pain response Dependence Constipation Depression Respiratory depression And more.  Withdrawal symptoms can include Flu like symptoms Nausea, vomiting And more Techniques to manage these symptoms Hydrate well Eat regular healthy meals Stay active Use relaxation techniques(deep breathing, meditating, yoga) Do Not substitute Alcohol to help with tapering If you have been on opioids for less than two weeks and do not have pain than it is ok to stop all together.  Plan to wean off of opioids This plan should start within one week post op of your joint replacement. Maintain the same interval or time between taking each dose and first decrease the dose.  Cut the total daily intake of opioids by one tablet each day Next start to increase the time between doses. The last dose that should be eliminated is the evening dose.      TED hose   Complete by: As directed    Use stockings (TED hose) for 2 weeks on both leg(s).  You may  remove them at night for sleeping.        Follow-up Information     Durene Romans, MD. Schedule an appointment as soon as possible for a visit in 2 week(s).   Specialty: Orthopedic Surgery Contact information: [address] [place] [address] [phone]                  Signed: Cassandria Anger 04/12/2023, 2:51 PM

## 2023-08-02 ENCOUNTER — Encounter (HOSPITAL_COMMUNITY): Payer: Self-pay

## 2023-08-02 NOTE — Progress Notes (Addendum)
 Anesthesia Review:  ERE:Wjujopz Lynwood  Cardiologist : Nephrology- LOV 07/29/23  Chest x-ray : EKG : 09/29/22  Echo : Stress test: Cardiac Cath :  Activity level: can do a flight of stairs without difficutly  Sleep Study/ CPAP : has cpap  Fasting Blood Sugar :      / Checks Blood Sugar -- times a day:   Blood Thinner/ Instructions /Last Dose: ASA / Instructions/ Last Dose :    07/23/23- CBC/Diff and Renal Fct panel  Labs done on 08/05/23 of cbc, bmp and hgba1c at preop appt.    DM- type 2 checks glucose daily in am  Hgbba1c-08/05/23- 5.9 Toujeo - Take 1/2 dose nite before surgery.    CBC and BMP done 08/05/23 routed to DR [facility].

## 2023-08-03 NOTE — Patient Instructions (Signed)
 SURGICAL WAITING ROOM VISITATION  Patients having surgery or a procedure may have no more than 2 support people in the waiting area - these visitors may rotate.    Children under the age of 21 must have an adult with them who is not the patient.  Due to an increase in RSV and influenza rates and associated hospitalizations, children ages 65 and under may not visit patients in [facility] hospitals.  If the patient needs to stay at the hospital during part of their recovery, the visitor guidelines for inpatient rooms apply. Pre-op nurse will coordinate an appropriate time for 1 support person to accompany patient in pre-op.  This support person may not rotate.    Please refer to the [facility] website for the visitor guidelines for Inpatients (after your surgery is over and you are in a regular room).       Your procedure is scheduled on: 08/11/2022    Report to [facility] Main Entrance    Report to admitting at  0515 AM   Call this number if you have problems the morning of surgery [phone]   Do not eat food :After Midnight.   After Midnight you may have the following liquids until _ 0415_____ AM DAY OF SURGERY  Water  Non-Citrus Juices (without pulp, NO RED-Apple, White grape, White cranberry) Black Coffee (NO MILK/CREAM OR CREAMERS, sugar ok)  Clear Tea (NO MILK/CREAM OR CREAMERS, sugar ok) regular and decaf                             Plain Jell-O (NO RED)                                           Fruit ices (not with fruit pulp, NO RED)                                     Popsicles (NO RED)                                                               Sports drinks like Gatorade (NO RED)                   The day of surgery:  Drink ONE (1) Pre-Surgery Clear Ensure or G2 at  0415AM  ( have completed by ) the morning of surgery. Drink in one sitting. Do not sip.  This drink was given to you during your hospital  pre-op appointment visit. Nothing else to drink  after completing the  Pre-Surgery Clear Ensure or G2.          If you have questions, please contact your surgeon's office.       Oral Hygiene is also important to reduce your risk of infection.                                    Remember - BRUSH YOUR TEETH THE MORNING OF SURGERY WITH YOUR REGULAR TOOTHPASTE  DENTURES WILL BE REMOVED PRIOR TO SURGERY PLEASE DO NOT APPLY Poly grip OR ADHESIVES!!!   Do NOT smoke after Midnight   Stop all vitamins and herbal supplements 7 days before surgery.   Take these medicines the morning of surgery with A SIP OF WATER :  allopurinol , cardizem , hydralazine               Toujeo -   DO NOT TAKE ANY ORAL DIABETIC MEDICATIONS DAY OF YOUR SURGERY  Bring CPAP mask and tubing day of surgery.                              You may not have any metal on your body including hair pins, jewelry, and body piercing             Do not wear make-up, lotions, powders, perfumes/cologne, or deodorant  Do not wear nail polish including gel and S&S, artificial/acrylic nails, or any other type of covering on natural nails including finger and toenails. If you have artificial nails, gel coating, etc. that needs to be removed by a nail salon please have this removed prior to surgery or surgery may need to be canceled/ delayed if the surgeon/ anesthesia feels like they are unable to be safely monitored.   Do not shave  48 hours prior to surgery.               Men may shave face and neck.   Do not bring valuables to the hospital. [place] IS NOT             RESPONSIBLE   FOR VALUABLES.   Contacts, glasses, dentures or bridgework may not be worn into surgery.   Bring small overnight bag day of surgery.   DO NOT BRING YOUR HOME MEDICATIONS TO THE HOSPITAL. PHARMACY WILL DISPENSE MEDICATIONS LISTED ON YOUR MEDICATION LIST TO YOU DURING YOUR ADMISSION IN THE HOSPITAL!    Patients discharged on the day of surgery will not be allowed to drive home.  Someone NEEDS to  stay with you for the first 24 hours after anesthesia.   Special Instructions: Bring a copy of your healthcare power of attorney and living will documents the day of surgery if you haven't scanned them before.              Please read over the following fact sheets you were given: IF YOU HAVE QUESTIONS ABOUT YOUR PRE-OP INSTRUCTIONS PLEASE CALL [phone]   If you received a COVID test during your pre-op visit  it is requested that you wear a mask when out in public, stay away from anyone that may not be feeling well and notify your surgeon if you develop symptoms. If you test positive for Covid or have been in contact with anyone that has tested positive in the last 10 days please notify you surgeon.      Pre-operative 5 CHG Bath Instructions   You can play a key role in reducing the risk of infection after surgery. Your skin needs to be as free of germs as possible. You can reduce the number of germs on your skin by washing with CHG (chlorhexidine  gluconate) soap before surgery. CHG is an antiseptic soap that kills germs and continues to kill germs even after washing.   DO NOT use if you have an allergy to chlorhexidine /CHG or antibacterial soaps. If your skin becomes reddened or irritated, stop using the CHG and notify one of our  RNs at [phone].   Please shower with the CHG soap starting 4 days before surgery using the following schedule:     Please keep in mind the following:  DO NOT shave, including legs and underarms, starting the day of your first shower.   You may shave your face at any point before/day of surgery.  Place clean sheets on your bed the day you start using CHG soap. Use a clean washcloth (not used since being washed) for each shower. DO NOT sleep with pets once you start using the CHG.   CHG Shower Instructions:  If you choose to wash your hair and private area, wash first with your normal shampoo/soap.  After you use shampoo/soap, rinse your hair and body  thoroughly to remove shampoo/soap residue.  Turn the water  OFF and apply about 3 tablespoons (45 ml) of CHG soap to a CLEAN washcloth.  Apply CHG soap ONLY FROM YOUR NECK DOWN TO YOUR TOES (washing for 3-5 minutes)  DO NOT use CHG soap on face, private areas, open wounds, or sores.  Pay special attention to the area where your surgery is being performed.  If you are having back surgery, having someone wash your back for you may be helpful. Wait 2 minutes after CHG soap is applied, then you may rinse off the CHG soap.  Pat dry with a clean towel  Put on clean clothes/pajamas   If you choose to wear lotion, please use ONLY the CHG-compatible lotions on the back of this paper.     Additional instructions for the day of surgery: DO NOT APPLY any lotions, deodorants, cologne, or perfumes.   Put on clean/comfortable clothes.  Brush your teeth.  Ask your nurse before applying any prescription medications to the skin.      CHG Compatible Lotions   Aveeno Moisturizing lotion  Cetaphil Moisturizing Cream  Cetaphil Moisturizing Lotion  Clairol Herbal Essence Moisturizing Lotion, Dry Skin  Clairol Herbal Essence Moisturizing Lotion, Extra Dry Skin  Clairol Herbal Essence Moisturizing Lotion, Normal Skin  Curel Age Defying Therapeutic Moisturizing Lotion with Alpha Hydroxy  Curel Extreme Care Body Lotion  Curel Soothing Hands Moisturizing Hand Lotion  Curel Therapeutic Moisturizing Cream, Fragrance-Free  Curel Therapeutic Moisturizing Lotion, Fragrance-Free  Curel Therapeutic Moisturizing Lotion, Original Formula  Eucerin Daily Replenishing Lotion  Eucerin Dry Skin Therapy Plus Alpha Hydroxy Crme  Eucerin Dry Skin Therapy Plus Alpha Hydroxy Lotion  Eucerin Original Crme  Eucerin Original Lotion  Eucerin Plus Crme Eucerin Plus Lotion  Eucerin TriLipid Replenishing Lotion  Keri Anti-Bacterial Hand Lotion  Keri Deep Conditioning Original Lotion Dry Skin Formula Softly Scented  Keri  Deep Conditioning Original Lotion, Fragrance Free Sensitive Skin Formula  Keri Lotion Fast Absorbing Fragrance Free Sensitive Skin Formula  Keri Lotion Fast Absorbing Softly Scented Dry Skin Formula  Keri Original Lotion  Keri Skin Renewal Lotion Keri Silky Smooth Lotion  Keri Silky Smooth Sensitive Skin Lotion  Nivea Body Creamy Conditioning Oil  Nivea Body Extra Enriched Teacher, Adult Education Moisturizing Lotion Nivea Crme  Nivea Skin Firming Lotion  NutraDerm 30 Skin Lotion  NutraDerm Skin Lotion  NutraDerm Therapeutic Skin Cream  NutraDerm Therapeutic Skin Lotion  ProShield Protective Hand Cream  Provon moisturizing lotion

## 2023-08-05 ENCOUNTER — Encounter (HOSPITAL_COMMUNITY): Payer: Self-pay

## 2023-08-05 ENCOUNTER — Encounter (HOSPITAL_COMMUNITY)
Admission: RE | Admit: 2023-08-05 | Discharge: 2023-08-05 | Disposition: A | Discharge status: Home / Self Care | Payer: Medicare PPO | Source: Ambulatory Visit | Attending: Orthopedic Surgery | Admitting: Orthopedic Surgery

## 2023-08-05 ENCOUNTER — Other Ambulatory Visit: Payer: Self-pay

## 2023-08-05 VITALS — BP 145/60 | HR 62 | Temp 97.9°F | Resp 16 | Ht 71.0 in | Wt 268.0 lb

## 2023-08-05 DIAGNOSIS — Z01818 Encounter for other preprocedural examination: Secondary | ICD-10-CM

## 2023-08-05 DIAGNOSIS — E119 Type 2 diabetes mellitus without complications: Secondary | ICD-10-CM | POA: Insufficient documentation

## 2023-08-05 DIAGNOSIS — Z01812 Encounter for preprocedural laboratory examination: Secondary | ICD-10-CM | POA: Insufficient documentation

## 2023-08-05 HISTORY — DX: Anxiety disorder, unspecified: F41.9

## 2023-08-05 HISTORY — DX: Sleep apnea, unspecified: G47.30

## 2023-08-05 LAB — BASIC METABOLIC PANEL
Anion gap: 13 (ref 5–15)
BUN: 69 mg/dL — ABNORMAL HIGH (ref 8–23)
CO2: 22 mmol/L (ref 22–32)
Calcium: 9.5 mg/dL (ref 8.9–10.3)
Chloride: 102 mmol/L (ref 98–111)
Creatinine, Ser: 1.99 mg/dL — ABNORMAL HIGH (ref 0.61–1.24)
GFR, Estimated: 35 mL/min — ABNORMAL LOW (ref >60)
Glucose, Bld: 104 mg/dL — ABNORMAL HIGH (ref 70–99)
Potassium: 4.5 mmol/L (ref 3.5–5.1)
Sodium: 137 mmol/L (ref 135–145)

## 2023-08-05 LAB — CBC
HCT: 34.4 % — ABNORMAL LOW (ref 39.0–52.0)
Hemoglobin: 11.3 g/dL — ABNORMAL LOW (ref 13.0–17.0)
MCH: 30.5 pg (ref 26.0–34.0)
MCHC: 32.8 g/dL (ref 30.0–36.0)
MCV: 92.7 fL (ref 80.0–100.0)
Platelets: 416 10*3/uL — ABNORMAL HIGH (ref 150–400)
RBC: 3.71 MIL/uL — ABNORMAL LOW (ref 4.22–5.81)
RDW: 15.3 % (ref 11.5–15.5)
WBC: 12.2 10*3/uL — ABNORMAL HIGH (ref 4.0–10.5)
nRBC: 0 % (ref 0.0–0.2)

## 2023-08-05 LAB — SURGICAL PCR SCREEN
MRSA, PCR: NEGATIVE
Staphylococcus aureus: NEGATIVE

## 2023-08-05 LAB — HEMOGLOBIN A1C
Hgb A1c MFr Bld: 5.9 % — ABNORMAL HIGH (ref 4.8–5.6)
Mean Plasma Glucose: 122.63 mg/dL

## 2023-08-05 LAB — GLUCOSE, CAPILLARY: Glucose-Capillary: 100 mg/dL — ABNORMAL HIGH (ref 70–99)

## 2023-08-12 ENCOUNTER — Ambulatory Visit (HOSPITAL_COMMUNITY): Payer: Self-pay | Admitting: Physician Assistant

## 2023-08-12 ENCOUNTER — Other Ambulatory Visit: Payer: Self-pay

## 2023-08-12 ENCOUNTER — Observation Stay (HOSPITAL_COMMUNITY)
Admission: RE | Admit: 2023-08-12 | Discharge: 2023-08-13 | Disposition: A | Discharge status: Home / Self Care | Payer: Medicare PPO | Attending: Orthopedic Surgery | Admitting: Orthopedic Surgery

## 2023-08-12 ENCOUNTER — Ambulatory Visit (HOSPITAL_COMMUNITY): Payer: Medicare PPO | Admitting: Certified Registered Nurse Anesthetist

## 2023-08-12 ENCOUNTER — Encounter (HOSPITAL_COMMUNITY): Payer: Self-pay | Admitting: Orthopedic Surgery

## 2023-08-12 ENCOUNTER — Encounter (HOSPITAL_COMMUNITY)
Admission: RE | Disposition: A | Discharge status: Home / Self Care | Payer: Self-pay | Source: Home / Self Care | Attending: Orthopedic Surgery

## 2023-08-12 DIAGNOSIS — Z87891 Personal history of nicotine dependence: Secondary | ICD-10-CM | POA: Insufficient documentation

## 2023-08-12 DIAGNOSIS — I129 Hypertensive chronic kidney disease with stage 1 through stage 4 chronic kidney disease, or unspecified chronic kidney disease: Secondary | ICD-10-CM | POA: Diagnosis not present

## 2023-08-12 DIAGNOSIS — G4733 Obstructive sleep apnea (adult) (pediatric): Secondary | ICD-10-CM | POA: Insufficient documentation

## 2023-08-12 DIAGNOSIS — E1122 Type 2 diabetes mellitus with diabetic chronic kidney disease: Secondary | ICD-10-CM | POA: Diagnosis not present

## 2023-08-12 DIAGNOSIS — Z96611 Presence of right artificial shoulder joint: Secondary | ICD-10-CM | POA: Diagnosis not present

## 2023-08-12 DIAGNOSIS — Z01818 Encounter for other preprocedural examination: Principal | ICD-10-CM

## 2023-08-12 DIAGNOSIS — M1712 Unilateral primary osteoarthritis, left knee: Principal | ICD-10-CM | POA: Insufficient documentation

## 2023-08-12 DIAGNOSIS — Z96651 Presence of right artificial knee joint: Secondary | ICD-10-CM | POA: Diagnosis not present

## 2023-08-12 DIAGNOSIS — N189 Chronic kidney disease, unspecified: Secondary | ICD-10-CM | POA: Insufficient documentation

## 2023-08-12 DIAGNOSIS — Z96652 Presence of left artificial knee joint: Secondary | ICD-10-CM

## 2023-08-12 HISTORY — PX: TOTAL KNEE ARTHROPLASTY: SHX125

## 2023-08-12 LAB — GLUCOSE, CAPILLARY
Glucose-Capillary: 82 mg/dL (ref 70–99)
Glucose-Capillary: 91 mg/dL (ref 70–99)

## 2023-08-12 SURGERY — ARTHROPLASTY, KNEE, TOTAL
Anesthesia: Monitor Anesthesia Care | Site: Knee | Laterality: Left

## 2023-08-12 MED ORDER — ONDANSETRON HCL 4 MG/2ML IJ SOLN
INTRAMUSCULAR | Status: DC | PRN
  Administered 2023-08-12: 4 mg via INTRAVENOUS

## 2023-08-12 MED ORDER — PROPOFOL 1000 MG/100ML IV EMUL
INTRAVENOUS | Status: AC
Start: 2023-08-12 — End: ?
  Filled 2023-08-12: qty 100

## 2023-08-12 MED ORDER — MIDAZOLAM HCL 2 MG/2ML IJ SOLN
INTRAMUSCULAR | Status: AC
  Filled 2023-08-12: qty 2

## 2023-08-12 MED ORDER — ALLOPURINOL 300 MG PO TABS
300.0000 mg | ORAL_TABLET | Freq: Every day | ORAL | Status: DC
  Administered 2023-08-12 – 2023-08-13 (×2): 300 mg via ORAL
  Filled 2023-08-12 (×2): qty 1

## 2023-08-12 MED ORDER — BUPIVACAINE IN DEXTROSE 0.75-8.25 % IT SOLN
INTRATHECAL | Status: DC | PRN
  Administered 2023-08-12: 1.8 mL via INTRATHECAL

## 2023-08-12 MED ORDER — DEXAMETHASONE SODIUM PHOSPHATE 10 MG/ML IJ SOLN
10.0000 mg | Freq: Once | INTRAMUSCULAR | Status: AC
  Administered 2023-08-13: 10 mg via INTRAVENOUS
  Filled 2023-08-12: qty 1

## 2023-08-12 MED ORDER — SODIUM CHLORIDE (PF) 0.9 % IJ SOLN
INTRAMUSCULAR | Status: DC | PRN
  Administered 2023-08-12: 61 mL

## 2023-08-12 MED ORDER — FENTANYL CITRATE (PF) 100 MCG/2ML IJ SOLN
INTRAMUSCULAR | Status: DC | PRN
  Administered 2023-08-12: 50 ug via INTRAVENOUS

## 2023-08-12 MED ORDER — OXYCODONE HCL 5 MG PO TABS
5.0000 mg | ORAL_TABLET | ORAL | Status: DC | PRN
Start: 2023-08-12 — End: 2023-08-13
  Administered 2023-08-12 (×2): 5 mg via ORAL
  Administered 2023-08-12 – 2023-08-13 (×2): 10 mg via ORAL
  Filled 2023-08-12: qty 1
  Filled 2023-08-12: qty 2
  Filled 2023-08-12: qty 1
  Filled 2023-08-12: qty 2

## 2023-08-12 MED ORDER — MIDAZOLAM HCL 5 MG/5ML IJ SOLN
INTRAMUSCULAR | Status: DC | PRN
  Administered 2023-08-12 (×2): 1 mg via INTRAVENOUS

## 2023-08-12 MED ORDER — 0.9 % SODIUM CHLORIDE (POUR BTL) OPTIME
TOPICAL | Status: DC | PRN
  Administered 2023-08-12: 1000 mL

## 2023-08-12 MED ORDER — METHOCARBAMOL 1000 MG/10ML IJ SOLN
500.0000 mg | Freq: Four times a day (QID) | INTRAMUSCULAR | Status: DC | PRN
  Filled 2023-08-12: qty 10

## 2023-08-12 MED ORDER — METOCLOPRAMIDE HCL 5 MG PO TABS: 5.0000 mg | ORAL_TABLET | Freq: Three times a day (TID) | ORAL | Status: DC | PRN

## 2023-08-12 MED ORDER — METOCLOPRAMIDE HCL 5 MG/ML IJ SOLN
5.0000 mg | Freq: Three times a day (TID) | INTRAMUSCULAR | Status: DC | PRN
Start: 2023-08-12 — End: 2023-08-13

## 2023-08-12 MED ORDER — CHLORHEXIDINE GLUCONATE 0.12 % MT SOLN
15.0000 mL | Freq: Once | OROMUCOSAL | Status: AC
  Administered 2023-08-12: 15 mL via OROMUCOSAL

## 2023-08-12 MED ORDER — BUPIVACAINE-EPINEPHRINE 0.25% -1:200000 IJ SOLN
INTRAMUSCULAR | Status: AC
  Filled 2023-08-12: qty 1

## 2023-08-12 MED ORDER — ONDANSETRON HCL 4 MG/2ML IJ SOLN
INTRAMUSCULAR | Status: AC
  Filled 2023-08-12: qty 2

## 2023-08-12 MED ORDER — HYDROMORPHONE HCL 1 MG/ML IJ SOLN
0.5000 mg | INTRAMUSCULAR | Status: DC | PRN
Start: 2023-08-12 — End: 2023-08-13
  Administered 2023-08-12: 1 mg via INTRAVENOUS
  Filled 2023-08-12: qty 1

## 2023-08-12 MED ORDER — ORAL CARE MOUTH RINSE: 15.0000 mL | Freq: Once | OROMUCOSAL | Status: AC

## 2023-08-12 MED ORDER — OXYCODONE HCL 5 MG PO TABS: 5.0000 mg | ORAL_TABLET | Freq: Once | ORAL | Status: DC | PRN

## 2023-08-12 MED ORDER — FENTANYL CITRATE PF 50 MCG/ML IJ SOSY: 25.0000 ug | PREFILLED_SYRINGE | INTRAMUSCULAR | Status: DC | PRN

## 2023-08-12 MED ORDER — SODIUM CHLORIDE 0.9% FLUSH: 10.0000 mL | Freq: Two times a day (BID) | INTRAVENOUS | Status: DC

## 2023-08-12 MED ORDER — ROPIVACAINE HCL 5 MG/ML IJ SOLN
INTRAMUSCULAR | Status: DC | PRN
  Administered 2023-08-12: 25 mL via PERINEURAL

## 2023-08-12 MED ORDER — FUROSEMIDE 40 MG PO TABS
40.0000 mg | ORAL_TABLET | Freq: Every day | ORAL | Status: DC
Start: 2023-08-12 — End: 2023-08-13
  Administered 2023-08-12: 40 mg via ORAL
  Filled 2023-08-12: qty 1

## 2023-08-12 MED ORDER — HYDRALAZINE HCL 50 MG PO TABS
50.0000 mg | ORAL_TABLET | Freq: Two times a day (BID) | ORAL | Status: DC
  Administered 2023-08-13: 50 mg via ORAL
  Filled 2023-08-12: qty 1

## 2023-08-12 MED ORDER — SODIUM CHLORIDE 0.9 % IR SOLN
Status: DC | PRN
  Administered 2023-08-12: 1000 mL

## 2023-08-12 MED ORDER — ATORVASTATIN CALCIUM 40 MG PO TABS
40.0000 mg | ORAL_TABLET | Freq: Every evening | ORAL | Status: DC
  Administered 2023-08-12: 40 mg via ORAL
  Filled 2023-08-12: qty 1

## 2023-08-12 MED ORDER — SENNA 8.6 MG PO TABS
2.0000 | ORAL_TABLET | Freq: Every day | ORAL | Status: DC
  Administered 2023-08-12: 17.2 mg via ORAL
  Filled 2023-08-12: qty 2

## 2023-08-12 MED ORDER — CEFAZOLIN SODIUM-DEXTROSE 2-4 GM/100ML-% IV SOLN
2.0000 g | Freq: Four times a day (QID) | INTRAVENOUS | Status: AC
  Administered 2023-08-12 (×2): 2 g via INTRAVENOUS
  Filled 2023-08-12 (×2): qty 100

## 2023-08-12 MED ORDER — MENTHOL 3 MG MT LOZG
1.0000 | LOZENGE | OROMUCOSAL | Status: DC | PRN
Start: 2023-08-12 — End: 2023-08-13

## 2023-08-12 MED ORDER — TRANEXAMIC ACID-NACL 1000-0.7 MG/100ML-% IV SOLN
1000.0000 mg | Freq: Once | INTRAVENOUS | Status: AC
  Administered 2023-08-12: 1000 mg via INTRAVENOUS
  Filled 2023-08-12: qty 100

## 2023-08-12 MED ORDER — SODIUM CHLORIDE 0.9% FLUSH: 3.0000 mL | INTRAVENOUS | Status: DC | PRN

## 2023-08-12 MED ORDER — POVIDONE-IODINE 10 % EX SWAB: 2.0000 | Freq: Once | CUTANEOUS | Status: DC

## 2023-08-12 MED ORDER — TRANEXAMIC ACID-NACL 1000-0.7 MG/100ML-% IV SOLN
1000.0000 mg | INTRAVENOUS | Status: AC
  Administered 2023-08-12: 1000 mg via INTRAVENOUS
  Filled 2023-08-12: qty 100

## 2023-08-12 MED ORDER — PROPOFOL 10 MG/ML IV BOLUS
INTRAVENOUS | Status: DC | PRN
  Administered 2023-08-12 (×2): 20 mg via INTRAVENOUS

## 2023-08-12 MED ORDER — DILTIAZEM HCL ER COATED BEADS 180 MG PO CP24
360.0000 mg | ORAL_CAPSULE | Freq: Every day | ORAL | Status: DC
  Administered 2023-08-12 – 2023-08-13 (×2): 360 mg via ORAL
  Filled 2023-08-12 (×2): qty 2

## 2023-08-12 MED ORDER — ALUM & MAG HYDROXIDE-SIMETH 200-200-20 MG/5ML PO SUSP: 30.0000 mL | ORAL | Status: DC | PRN

## 2023-08-12 MED ORDER — PROPOFOL 500 MG/50ML IV EMUL
INTRAVENOUS | Status: DC | PRN
  Administered 2023-08-12: 125 ug/kg/min via INTRAVENOUS

## 2023-08-12 MED ORDER — OXYCODONE HCL 5 MG/5ML PO SOLN: 5.0000 mg | Freq: Once | ORAL | Status: DC | PRN

## 2023-08-12 MED ORDER — STERILE WATER FOR IRRIGATION IR SOLN
Status: DC | PRN
  Administered 2023-08-12: 1000 mL

## 2023-08-12 MED ORDER — SODIUM CHLORIDE 0.9 % IV SOLN
3.0000 g | INTRAVENOUS | Status: AC
  Administered 2023-08-12: 3 g via INTRAVENOUS
  Filled 2023-08-12: qty 3

## 2023-08-12 MED ORDER — SODIUM CHLORIDE 0.9% FLUSH
3.0000 mL | Freq: Two times a day (BID) | INTRAVENOUS | Status: DC
  Administered 2023-08-13: 3 mL via INTRAVENOUS

## 2023-08-12 MED ORDER — ACETAMINOPHEN 500 MG PO TABS
1000.0000 mg | ORAL_TABLET | Freq: Four times a day (QID) | ORAL | Status: AC
  Administered 2023-08-12 – 2023-08-13 (×3): 1000 mg via ORAL
  Filled 2023-08-12 (×3): qty 2

## 2023-08-12 MED ORDER — FENTANYL CITRATE (PF) 100 MCG/2ML IJ SOLN
INTRAMUSCULAR | Status: AC
  Filled 2023-08-12: qty 2

## 2023-08-12 MED ORDER — ASPIRIN 81 MG PO CHEW
81.0000 mg | CHEWABLE_TABLET | Freq: Two times a day (BID) | ORAL | Status: DC
  Administered 2023-08-12 – 2023-08-13 (×2): 81 mg via ORAL
  Filled 2023-08-12 (×2): qty 1

## 2023-08-12 MED ORDER — PHENOL 1.4 % MT LIQD: 1.0000 | OROMUCOSAL | Status: DC | PRN

## 2023-08-12 MED ORDER — METHOCARBAMOL 500 MG PO TABS
500.0000 mg | ORAL_TABLET | Freq: Four times a day (QID) | ORAL | Status: DC | PRN
  Administered 2023-08-12 – 2023-08-13 (×2): 500 mg via ORAL
  Filled 2023-08-12 (×2): qty 1

## 2023-08-12 MED ORDER — BISACODYL 10 MG RE SUPP: 10.0000 mg | Freq: Every day | RECTAL | Status: DC | PRN

## 2023-08-12 MED ORDER — DIPHENHYDRAMINE HCL 12.5 MG/5ML PO ELIX: 12.5000 mg | ORAL_SOLUTION | ORAL | Status: DC | PRN

## 2023-08-12 MED ORDER — ONDANSETRON HCL 4 MG PO TABS: 4.0000 mg | ORAL_TABLET | Freq: Four times a day (QID) | ORAL | Status: DC | PRN

## 2023-08-12 MED ORDER — OXYCODONE HCL 5 MG PO TABS
10.0000 mg | ORAL_TABLET | ORAL | Status: DC | PRN
Start: 2023-08-12 — End: 2023-08-13

## 2023-08-12 MED ORDER — ACETAMINOPHEN 325 MG PO TABS: 325.0000 mg | ORAL_TABLET | Freq: Four times a day (QID) | ORAL | Status: DC | PRN

## 2023-08-12 MED ORDER — PHENYLEPHRINE HCL-NACL 20-0.9 MG/250ML-% IV SOLN
INTRAVENOUS | Status: DC | PRN
  Administered 2023-08-12: 20 ug/min via INTRAVENOUS

## 2023-08-12 MED ORDER — ONDANSETRON HCL 4 MG/2ML IJ SOLN
4.0000 mg | Freq: Four times a day (QID) | INTRAMUSCULAR | Status: DC | PRN
  Administered 2023-08-13: 4 mg via INTRAVENOUS
  Filled 2023-08-12: qty 2

## 2023-08-12 MED ORDER — FUROSEMIDE 40 MG PO TABS
80.0000 mg | ORAL_TABLET | Freq: Every day | ORAL | Status: DC
  Administered 2023-08-13: 80 mg via ORAL
  Filled 2023-08-12: qty 2

## 2023-08-12 MED ORDER — ONDANSETRON HCL 4 MG/2ML IJ SOLN: 4.0000 mg | Freq: Four times a day (QID) | INTRAMUSCULAR | Status: DC | PRN

## 2023-08-12 MED ORDER — LACTATED RINGERS IV SOLN
INTRAVENOUS | Status: DC
Start: 2023-08-12 — End: 2023-08-12

## 2023-08-12 MED ORDER — POLYETHYLENE GLYCOL 3350 17 G PO PACK
17.0000 g | PACK | Freq: Two times a day (BID) | ORAL | Status: DC
  Administered 2023-08-12 – 2023-08-13 (×3): 17 g via ORAL
  Filled 2023-08-12 (×3): qty 1

## 2023-08-12 MED ORDER — DEXAMETHASONE SODIUM PHOSPHATE 10 MG/ML IJ SOLN
8.0000 mg | Freq: Once | INTRAMUSCULAR | Status: AC
  Administered 2023-08-12: 8 mg via INTRAVENOUS

## 2023-08-12 MED ORDER — DEXAMETHASONE SODIUM PHOSPHATE 10 MG/ML IJ SOLN
INTRAMUSCULAR | Status: AC
  Filled 2023-08-12: qty 1

## 2023-08-12 MED ORDER — KETOROLAC TROMETHAMINE 30 MG/ML IJ SOLN
INTRAMUSCULAR | Status: AC
  Filled 2023-08-12: qty 1

## 2023-08-12 MED ORDER — SODIUM CHLORIDE (PF) 0.9 % IJ SOLN
INTRAMUSCULAR | Status: AC
  Filled 2023-08-12: qty 30

## 2023-08-12 MED ORDER — FUROSEMIDE 40 MG PO TABS: 40.0000 mg | ORAL_TABLET | ORAL | Status: DC

## 2023-08-12 SURGICAL SUPPLY — 45 items
ATTUNE MED ANAT PAT 38 KNEE (Knees) IMPLANT
BAG COUNTER SPONGE SURGICOUNT (BAG) IMPLANT
BAG ZIPLOCK 12X15 (MISCELLANEOUS) IMPLANT
BASEPLATE TIB CMT FB PCKT SZ 8 (Knees) IMPLANT
BLADE SAW SGTL 13.0X1.19X90.0M (BLADE) ×1 IMPLANT
BNDG ELASTIC 6INX 5YD STR LF (GAUZE/BANDAGES/DRESSINGS) ×1 IMPLANT
BOWL SMART MIX CTS (DISPOSABLE) ×1 IMPLANT
CEMENT HV SMART SET (Cement) ×2 IMPLANT
COMP FEM CMT ATTUNE KNEE 7 LT (Joint) ×1 IMPLANT
COMPONENT FEM CMT ATTN KN 7 LT (Joint) IMPLANT
COVER SURGICAL LIGHT HANDLE (MISCELLANEOUS) ×1 IMPLANT
CUFF TRNQT CYL 34X4.125X (TOURNIQUET CUFF) ×1 IMPLANT
DERMABOND ADVANCED .7 DNX12 (GAUZE/BANDAGES/DRESSINGS) ×1 IMPLANT
DRAPE U-SHAPE 47X51 STRL (DRAPES) ×1 IMPLANT
DRESSING AQUACEL AG SP 3.5X10 (GAUZE/BANDAGES/DRESSINGS) ×1 IMPLANT
DRSG AQUACEL AG SP 3.5X10 (GAUZE/BANDAGES/DRESSINGS) ×1
DURAPREP 26ML APPLICATOR (WOUND CARE) ×2 IMPLANT
ELECT REM PT RETURN 15FT ADLT (MISCELLANEOUS) ×1 IMPLANT
GLOVE BIO SURGEON STRL SZ 6 (GLOVE) ×1 IMPLANT
GLOVE BIOGEL PI IND STRL 6.5 (GLOVE) ×1 IMPLANT
GLOVE BIOGEL PI IND STRL 7.5 (GLOVE) ×1 IMPLANT
GLOVE ORTHO TXT STRL SZ7.5 (GLOVE) ×2 IMPLANT
GOWN STRL REUS W/ TWL LRG LVL3 (GOWN DISPOSABLE) ×2 IMPLANT
HOLDER FOLEY CATH W/STRAP (MISCELLANEOUS) IMPLANT
INSERT TIB ATT KNEE SZ 7 8 LM (Insert) IMPLANT
KIT TURNOVER KIT A (KITS) IMPLANT
MANIFOLD NEPTUNE II (INSTRUMENTS) ×1 IMPLANT
NDL SAFETY ECLIPSE 18X1.5 (NEEDLE) IMPLANT
NS IRRIG 1000ML POUR BTL (IV SOLUTION) ×1 IMPLANT
PACK TOTAL KNEE CUSTOM (KITS) ×1 IMPLANT
PIN FIX SIGMA LCS THRD HI (PIN) IMPLANT
PROTECTOR NERVE ULNAR (MISCELLANEOUS) ×1 IMPLANT
SET HNDPC FAN SPRY TIP SCT (DISPOSABLE) ×1 IMPLANT
SET PAD KNEE POSITIONER (MISCELLANEOUS) ×1 IMPLANT
SPIKE FLUID TRANSFER (MISCELLANEOUS) ×2 IMPLANT
SUT MNCRL AB 4-0 PS2 18 (SUTURE) ×1 IMPLANT
SUT STRATAFIX PDS+ 0 24IN (SUTURE) ×1 IMPLANT
SUT VIC AB 1 CT1 36 (SUTURE) ×1 IMPLANT
SUT VIC AB 2-0 CT1 TAPERPNT 27 (SUTURE) ×2 IMPLANT
SYR 3ML LL SCALE MARK (SYRINGE) ×1 IMPLANT
TOWEL GREEN STERILE FF (TOWEL DISPOSABLE) ×1 IMPLANT
TRAY FOLEY MTR SLVR 16FR STAT (SET/KITS/TRAYS/PACK) ×1 IMPLANT
TUBE SUCTION HIGH CAP CLEAR NV (SUCTIONS) ×1 IMPLANT
WATER STERILE IRR 1000ML POUR (IV SOLUTION) ×2 IMPLANT
WRAP KNEE MAXI GEL POST OP (GAUZE/BANDAGES/DRESSINGS) ×1 IMPLANT

## 2023-08-12 NOTE — Anesthesia Procedure Notes (Signed)
Spinal  Patient location during procedure: OR Start time: 08/12/2023 7:20 AM End time: 08/12/2023 7:24 AM Reason for block: surgical anesthesia Staffing Performed: anesthesiologist  Anesthesiologist: Achille Rich, MD Performed by: Achille Rich, MD Authorized by: Achille Rich, MD   Preanesthetic Checklist Completed: patient identified, IV checked, risks and benefits discussed, surgical consent, monitors and equipment checked, pre-op evaluation and timeout performed Spinal Block Patient position: sitting Prep: DuraPrep Patient monitoring: cardiac monitor, continuous pulse ox and blood pressure Approach: midline Location: L3-4 Injection technique: single-shot Needle Needle type: Pencan  Needle gauge: 24 G Needle length: 9 cm Assessment Sensory level: T10 Events: CSF return Additional Notes Functioning IV was confirmed and monitors were applied. Sterile prep and drape, including hand hygiene and sterile gloves were used. The patient was positioned and the spine was prepped. The skin was anesthetized with lidocaine.  Free flow of clear CSF was obtained prior to injecting local anesthetic into the CSF.  The spinal needle aspirated freely following injection.  The needle was carefully withdrawn.  The patient tolerated the procedure well.

## 2023-08-12 NOTE — Anesthesia Preprocedure Evaluation (Signed)
Anesthesia Evaluation  Patient identified by MRN, date of birth, ID band Patient awake    Reviewed: Allergy & Precautions, H&P , NPO status , Patient's Chart, lab work & pertinent test results  Airway Mallampati: II   Neck ROM: full    Dental   Pulmonary sleep apnea , former smoker   breath sounds clear to auscultation       Cardiovascular hypertension,  Rhythm:regular Rate:Normal     Neuro/Psych    GI/Hepatic   Endo/Other  diabetes, Type 2  Class 3 obesity  Renal/GU Renal InsufficiencyRenal disease     Musculoskeletal  (+) Arthritis ,    Abdominal   Peds  Hematology   Anesthesia Other Findings   Reproductive/Obstetrics                             Anesthesia Physical Anesthesia Plan  ASA: 3  Anesthesia Plan: MAC and Spinal   Post-op Pain Management: Regional block*   Induction: Intravenous  PONV Risk Score and Plan: 1 and Propofol infusion, Treatment may vary due to age or medical condition and Midazolam  Airway Management Planned: Simple Face Mask  Additional Equipment:   Intra-op Plan:   Post-operative Plan:   Informed Consent: I have reviewed the patients History and Physical, chart, labs and discussed the procedure including the risks, benefits and alternatives for the proposed anesthesia with the patient or authorized representative who has indicated his/her understanding and acceptance.     Dental advisory given  Plan Discussed with: CRNA, Anesthesiologist and Surgeon  Anesthesia Plan Comments:        Anesthesia Quick Evaluation

## 2023-08-12 NOTE — Discharge Instructions (Signed)

## 2023-08-12 NOTE — Interval H&P Note (Signed)
History and Physical Interval Note:  08/12/2023 7:05 AM  Dwayne Gomez  has presented today for surgery, with the diagnosis of left knee osteoarthritis.  The various methods of treatment have been discussed with the patient and family. After consideration of risks, benefits and other options for treatment, the patient has consented to  Procedure(s): TOTAL KNEE ARTHROPLASTY (Left) as a surgical intervention.  The patient's history has been reviewed, patient examined, no change in status, stable for surgery.  I have reviewed the patient's chart and labs.  Questions were answered to the patient's satisfaction.     Shelda Pal

## 2023-08-12 NOTE — Transfer of Care (Signed)
Immediate Anesthesia Transfer of Care Note  Patient: Dwayne Gomez  Procedure(s) Performed: TOTAL KNEE ARTHROPLASTY (Left: Knee)  Patient Location: PACU  Anesthesia Type:Regional and Spinal  Level of Consciousness: awake and patient cooperative  Airway & Oxygen Therapy: Patient Spontanous Breathing and Patient connected to face mask oxygen  Post-op Assessment: Report given to RN and Post -op Vital signs reviewed and stable  Post vital signs: Reviewed and stable  Last Vitals:  Vitals Value Taken Time  BP 122/48 08/12/23 0900  Temp    Pulse 59 08/12/23 0902  Resp 14 08/12/23 0902  SpO2 100 % 08/12/23 0902  Vitals shown include unfiled device data.  Last Pain:  Vitals:   08/12/23 0557  TempSrc:   PainSc: 0-No pain         Complications: No notable events documented.

## 2023-08-12 NOTE — Op Note (Signed)
NAME:  Dwayne Gomez                      MEDICAL RECORD NO.:  [id number]                             FACILITY:  [facility]      PHYSICIAN:  Madlyn Frankel. Charlann Boxer, M.D.  DATE OF BIRTH:  1951-05-16      DATE OF PROCEDURE:  08/12/2023                                     OPERATIVE REPORT         PREOPERATIVE DIAGNOSIS:  Left knee osteoarthritis.      POSTOPERATIVE DIAGNOSIS:  Left knee osteoarthritis.      FINDINGS:  The patient was noted to have complete loss of cartilage and   bone-on-bone arthritis with associated osteophytes in the medial and patellofemoral compartments of   the knee with a significant synovitis and associated effusion.  The patient had failed months of conservative treatment including medications, injection therapy, activity modification.     PROCEDURE:  Left total knee replacement.      COMPONENTS USED:  DePuy Attune FB CR MS  knee   system, a size 7 femur, 8 tibia, size 8 mm CR MS AOX insert, and 38 anatomic patellar   button.      SURGEON:  Madlyn Frankel. Charlann Boxer, M.D.      ASSISTANT:  Rosalene Billings, PA-C.      ANESTHESIA:  Regional and Spinal.      SPECIMENS:  None.      COMPLICATION:  None.      DRAINS:  None.  EBL: <100 cc      TOURNIQUET TIME:  28 min at 225 mmHg      The patient was stable to the recovery room.      INDICATION FOR PROCEDURE:  Dwayne Gomez is a 73 y.o. male patient of   mine.  The patient had been seen, evaluated, and treated for months conservatively in the   office with medication, activity modification, and injections.  The patient had   radiographic changes of bone-on-bone arthritis with endplate sclerosis and osteophytes noted.  Based on the radiographic changes and failed conservative measures, the patient   decided to proceed with definitive treatment, total knee replacement.  Risks of infection, DVT, component failure, need for revision surgery, neurovascular injury were reviewed in the office setting.  The postop course was  reviewed stressing the efforts to maximize post-operative satisfaction and function.  Consent was obtained for benefit of pain   relief.      PROCEDURE IN DETAIL:  The patient was brought to the operative theater.   Once adequate anesthesia, preoperative antibiotics, 2 gm of Ancef,1 gm of Tranexamic Acid, and 10 mg of Decadron administered, the patient was positioned supine with a left thigh tourniquet placed.  The  left lower extremity was prepped and draped in sterile fashion.  A time-   out was performed identifying the patient, planned procedure, and the appropriate extremity.      The left lower extremity was placed in the [facility] leg holder.  The leg was   exsanguinated, tourniquet elevated to 225 mmHg.  A midline incision was   made followed by median parapatellar arthrotomy.  Following initial   exposure,  attention was first directed to the patella.  Precut   measurement was noted to be 24 mm.  I resected down to 14 mm and used a   38 anatomic patellar button to restore patellar height as well as cover the cut surface.      The lug holes were drilled and a metal shim was placed to protect the   patella from retractors and saw blade during the procedure.      At this point, attention was now directed to the femur.  The femoral   canal was opened with a drill, irrigated to try to prevent fat emboli.  An   intramedullary rod was passed at 5 degrees valgus, 9 mm of bone was   resected off the distal femur.  Following this resection, the tibia was   subluxated anteriorly.  Using the extramedullary guide, 5 mm of bone was resected off   the proximal medial tibia.  We confirmed the gap would be   stable medially and laterally with a size 6 spacer block as well as confirmed that the tibial cut was perpendicular in the coronal plane, checking with an alignment rod.      Once this was done, I sized the femur to be a size 7 in the anterior-   posterior dimension, chose a standard component  based on medial and   lateral dimension.  The size 7 rotation block was then pinned in   position anterior referenced using the C-clamp to set rotation.  The   anterior, posterior, and  chamfer cuts were made without difficulty nor   notching making certain that I was along the anterior cortex to help   with flexion gap stability.      The final box cut was made off the lateral aspect of distal femur.      At this point, the tibia was sized to be a size 8.  The size 8 tray was   then pinned in position through the medial third of the tubercle,   drilled, and keel punched.  Trial reduction was now carried with a 7 femur,  8 tibia, a size 8 mm CR MS insert, and the 38 anatomic patella botton.  The knee was brought to full extension with good flexion stability with the patella   tracking through the trochlea without application of pressure.  Given   all these findings the trial components removed.  Final components were   opened and cement was mixed.  The knee was irrigated with normal saline solution and pulse lavage.  The synovial lining was   then injected with 30 cc of 0.25% Marcaine with epinephrine, 1 cc of Toradol and 30 cc of NS for a total of 61 cc.     Final implants were then cemented onto cleaned and dried cut surfaces of bone with the knee brought to extension with a size 8 mm CR MS trial insert.      Once the cement had fully cured, excess cement was removed   throughout the knee.  I confirmed that I was satisfied with the range of   motion and stability, and the final size 8 mm CR MS AOX insert was chosen.  It was   placed into the knee.      The tourniquet had been let down at 2 minutes.  No significant   hemostasis was required.  The extensor mechanism was then reapproximated using #1 Vicryl and #1 Stratafix sutures with the knee  in flexion.  The   remaining wound was closed with 2-0 Vicryl and running 4-0 Monocryl.   The knee was cleaned, dried, dressed sterilely using  Dermabond and   Aquacel dressing.  The patient was then   brought to recovery room in stable condition, tolerating the procedure   well.   Please note that Physician Assistant, Rosalene Billings, PA-C was present for the entirety of the case, and was utilized for pre-operative positioning, peri-operative retractor management, general facilitation of the procedure and for primary wound closure at the end of the case.              Madlyn Frankel Charlann Boxer, M.D.    08/12/2023 8:37 AM

## 2023-08-12 NOTE — Evaluation (Signed)
Physical Therapy Evaluation Patient Details Name: Dwayne Gomez MRN: [id number] DOB: [birth year] Today's Date: 08/12/2023  History of Present Illness  Pt s/p L TKR and with hx of CKD, DM, gout, anemia, htn, R R TSR (3/24) and R TKR (9/29)  Clinical Impression  Pt s/p L TKR and presents with decreased L LE strength/ROM and post op pain limiting functional mobility.  Pt should progress well to dc home with family assist and reports first OP PT scheduled for 08/16/23.        If plan is discharge home, recommend the following: A little help with walking and/or transfers;A little help with bathing/dressing/bathroom;Assistance with cooking/housework;Assist for transportation;Help with stairs or ramp for entrance   Can travel by private vehicle        Equipment Recommendations None recommended by PT  Recommendations for Other Services       Functional Status Assessment Patient has had a recent decline in their functional status and demonstrates the ability to make significant improvements in function in a reasonable and predictable amount of time.     Precautions / Restrictions Precautions Precautions: Fall;Knee Restrictions Weight Bearing Restrictions Per Provider Order: No Other Position/Activity Restrictions: WBAT      Mobility  Bed Mobility Overal bed mobility: Needs Assistance Bed Mobility: Supine to Sit     Supine to sit: Supervision     General bed mobility comments: for safety    Transfers Overall transfer level: Needs assistance Equipment used: Rolling walker (2 wheels) Transfers: Sit to/from Stand Sit to Stand: Contact guard assist           General transfer comment: Steady assist with cues for LE management and use of UEs to self assist    Ambulation/Gait Ambulation/Gait assistance: Contact guard assist Gait Distance (Feet): 90 Feet Assistive device: Rolling walker (2 wheels) Gait Pattern/deviations: Step-to pattern, Step-through pattern, Decreased  step length - right, Decreased step length - left, Shuffle, Trunk flexed Gait velocity: decr     General Gait Details: cues for sequence, posture and position from [employer]            Wheelchair Mobility     Tilt Bed    Modified Rankin (Stroke Patients Only)       Balance Overall balance assessment: Mild deficits observed, not formally tested                                           Pertinent Vitals/Pain Pain Assessment Pain Assessment: 0-10 Pain Score: 4  Pain Location: L knee Pain Descriptors / Indicators: Aching, Sore Pain Intervention(s): Limited activity within patient's tolerance, Monitored during session, Premedicated before session, Ice applied    Home Living Family/patient expects to be discharged to:: Private residence Living Arrangements: Spouse/significant other Available Help at Discharge: Family Type of Home: House Home Access: Stairs to enter Entrance Stairs-Rails: Left Entrance Stairs-Number of Steps: 4   Home Layout: One level Home Equipment: Agricultural consultant (2 wheels);Cane - single point      Prior Function Prior Level of Function : Independent/Modified Independent             Mobility Comments: IND with all ADLs, self care tasks, IADLS       Extremity/Trunk Assessment   Upper Extremity Assessment Upper Extremity Assessment: Overall WFL for tasks assessed    Lower Extremity Assessment Lower Extremity Assessment: LLE deficits/detail    Cervical /  Trunk Assessment Cervical / Trunk Assessment: Normal  Communication   Communication Communication: No apparent difficulties  Cognition Arousal: Alert Behavior During Therapy: WFL for tasks assessed/performed Overall Cognitive Status: Within Functional Limits for tasks assessed                                          General Comments      Exercises Total Joint Exercises Ankle Circles/Pumps: AROM, Both, 15 reps, Supine    Assessment/Plan    PT Assessment Patient needs continued PT services  PT Problem List Decreased strength;Decreased range of motion;Decreased activity tolerance;Decreased balance;Decreased mobility;Decreased knowledge of use of DME;Pain       PT Treatment Interventions DME instruction;Gait training;Stair training;Functional mobility training;Therapeutic activities;Therapeutic exercise;Balance training;Patient/family education    PT Goals (Current goals can be found in the Care Plan section)  Acute Rehab PT Goals Patient Stated Goal: Regain IND PT Goal Formulation: With patient Time For Goal Achievement: 08/19/23 Potential to Achieve Goals: Good    Frequency 7X/week     Co-evaluation               AM-PAC PT "6 Clicks" Mobility  Outcome Measure Help needed turning from your back to your side while in a flat bed without using bedrails?: A Little Help needed moving from lying on your back to sitting on the side of a flat bed without using bedrails?: A Little Help needed moving to and from a bed to a chair (including a wheelchair)?: A Little Help needed standing up from a chair using your arms (e.g., wheelchair or bedside chair)?: A Little Help needed to walk in hospital room?: A Little Help needed climbing 3-5 steps with a railing? : A Lot 6 Click Score: 17    End of Session Equipment Utilized During Treatment: Gait belt Activity Tolerance: Patient tolerated treatment well Patient left: in chair;with call bell/phone within reach;with chair alarm set;with family/visitor present Nurse Communication: Mobility status PT Visit Diagnosis: Difficulty in walking, not elsewhere classified (R26.2)    Time: 0865-7846 PT Time Calculation (min) (ACUTE ONLY): 28 min   Charges:   PT Evaluation $PT Eval Low Complexity: 1 Low PT Treatments $Gait Training: 8-22 mins PT General Charges $$ ACUTE PT VISIT: 1 Visit         Mauro Kaufmann PT Acute Rehabilitation Services Pager  [phone] Office [phone]   Cailah Reach 08/12/2023, 5:03 PM

## 2023-08-12 NOTE — H&P (Signed)
TOTAL KNEE ADMISSION H&P  Patient is being admitted for left total knee arthroplasty.  Therapy Plans: outpatient therapy at [facility] Disposition: Home with wife Planned DVT Prophylaxis: aspirin 81mg  BID DME needed: none PCP: clearance received with prior sx TXA: IV Allergies: NKDA Anesthesia Concerns: none BMI: 39.4 Last HgbA1c: 6.1 last - will need updated one   Other: - Hx of right total shoulder by Dr. Rennis Chris this year & TKA few months ago -- did well - oxycodone, robaxin, tylenol - No hx of VTE or cancer - CKD > Cr. ~2 -- no NSAIDs - staying overnight   Subjective:  Chief Complaint:left knee pain.  HPI: Dwayne Gomez, 73 y.o. male, has a history of pain and functional disability in the left knee due to arthritis and has failed non-surgical conservative treatments for greater than 12 weeks to includeNSAID's and/or analgesics, corticosteriod injections, and activity modification.  Onset of symptoms was gradual, starting 2 years ago with gradually worsening course since that time. The patient noted no past surgery on the left knee(s).  Patient currently rates pain in the left knee(s) at 8 out of 10 with activity. Patient has worsening of pain with activity and weight bearing and pain that interferes with activities of daily living.  Patient has evidence of joint space narrowing by imaging studies.  There is no active infection.  Patient Active Problem List   Diagnosis Date Noted   S/P total knee arthroplasty, right 03/30/2023   OSA (obstructive sleep apnea) 02/09/2021   Morbid obesity due to excess calories (HCC) 02/09/2021   Past Medical History:  Diagnosis Date   Anemia    Arthritis    gout   Chronic kidney disease    Diabetes mellitus without complication (HCC)    type 2   Family history of adverse reaction to anesthesia    Father- hard time waking up after anesthesia   Gout    High cholesterol    Hypertension    Sleep apnea    cpap    Past Surgical  History:  Procedure Laterality Date   APPENDECTOMY  03/28/1967   CATARACT EXTRACTION Bilateral    COLONOSCOPY     REVERSE SHOULDER ARTHROPLASTY Right 10/02/2022   Procedure: REVERSE SHOULDER ARTHROPLASTY;  Surgeon: Francena Hanly, MD;  Location: [facility];  Service: Orthopedics;  Laterality: Right;    TOTAL KNEE ARTHROPLASTY Right 03/30/2023   Procedure: TOTAL KNEE ARTHROPLASTY;  Surgeon: Durene Romans, MD;  Location: [facility];  Service: Orthopedics;  Laterality: Right;    Current Facility-Administered Medications  Medication Dose Route Frequency Provider Last Rate Last Admin   ceFAZolin (ANCEF) 3 g in sodium chloride 0.9 % 100 mL IVPB  3 g Intravenous On Call to OR Cassandria Anger, PA-C       dexamethasone (DECADRON) injection 8 mg  8 mg Intravenous Once Cassandria Anger, PA-C       lactated ringers infusion   Intravenous Continuous Bethena Midget, MD 10 mL/hr at 08/12/23 0615 Continued from Pre-op at 08/12/23 0615   povidone-iodine 10 % swab 2 Application  2 Application Topical Once Cassandria Anger, PA-C       sodium chloride flush (NS) 0.9 % injection 10 mL  10 mL Intravenous Q12H Rosalene Billings R, PA-C       tranexamic acid (CYKLOKAPRON) IVPB 1,000 mg  1,000 mg Intravenous To OR Cassandria Anger, PA-C       Allergies  Allergen Reactions   Ace Inhibitors Other (See Comments)  ACUTE KIDNEY INJURY AND HYPERKALEMIA    Fenofibrate     Interferes with kidneys   Spironolactone     Interferes with kidneys     Social History   Tobacco Use   Smoking status: Former    Current packs/day: 0.00    Average packs/day: 1 pack/day for 3.0 years (3.0 ttl pk-yrs)    Types: Cigarettes    Start date: 68    Quit date: 07/28/1975    Years since quitting: 48.0   Smokeless tobacco: Never  Substance Use Topics   Alcohol use: Never    Family History  Problem Relation Age of Onset   Cancer Mother        breast   Cancer Father        prostate   Diabetes Father      Review of Systems   Constitutional:  Negative for chills and fever.  Respiratory:  Negative for cough and shortness of breath.   Cardiovascular:  Negative for chest pain.  Gastrointestinal:  Negative for nausea and vomiting.  Musculoskeletal:  Positive for arthralgias.     Objective:  Physical Exam Well nourished and well developed. General: Alert and oriented x3, cooperative and pleasant, no acute distress. Head: normocephalic, atraumatic, neck supple. Eyes: EOMI.  Musculoskeletal: Right knee exam: His surgical incision remains well-healed without signs of infection Slight flexion contracture with flexion over 110 degrees No significant lower extremity edema or erythema  Left knee exam: No palpable effusion, warmth erythema Slight flexion contracture with flexion over 100 degrees with pain Tenderness at the joint lines Stable medial and lateral collateral ligaments  Calves soft and nontender. Motor function intact in LE. Strength 5/5 LE bilaterally. Neuro: Distal pulses 2+. Sensation to light touch intact in LE.  Vital signs in last 24 hours: Temp:  [98.1 F (36.7 C)] 98.1 F (36.7 C) (01/16 0542) Pulse Rate:  [63] 63 (01/16 0542) Resp:  [16] 16 (01/16 0542) BP: (136)/(61) 136/61 (01/16 0542) SpO2:  [96 %] 96 % (01/16 0542) Weight:  [121.6 kg] 121.6 kg (01/16 0557)  Labs:   Estimated body mass index is 37.38 kg/m as calculated from the following:   Height as of this encounter: 5\' 11"  (1.803 m).   Weight as of this encounter: 121.6 kg.   Imaging Review Plain radiographs demonstrate severe degenerative joint disease of the left knee(s). The overall alignment isneutral. The bone quality appears to be adequate for age and reported activity level.      Assessment/Plan:  End stage arthritis, left knee   The patient history, physical examination, clinical judgment of the provider and imaging studies are consistent with end stage degenerative joint disease of the left knee(s) and  total knee arthroplasty is deemed medically necessary. The treatment options including medical management, injection therapy arthroscopy and arthroplasty were discussed at length. The risks and benefits of total knee arthroplasty were presented and reviewed. The risks due to aseptic loosening, infection, stiffness, patella tracking problems, thromboembolic complications and other imponderables were discussed. The patient acknowledged the explanation, agreed to proceed with the plan and consent was signed. Patient is being admitted for inpatient treatment for surgery, pain control, PT, OT, prophylactic antibiotics, VTE prophylaxis, progressive ambulation and ADL's and discharge planning. The patient is planning to be discharged  home.     Patient's anticipated LOS is less than 2 midnights, meeting these requirements: - Younger than 34 - Lives within 1 hour of care - Has a competent adult at home to recover with post-op  recover - NO history of  - Chronic pain requiring opiods  - Diabetes  - Coronary Artery Disease  - Heart failure  - Heart attack  - Stroke  - DVT/VTE  - Cardiac arrhythmia  - Respiratory Failure/COPD  - Renal failure  - Anemia  - Advanced Liver disease  Rosalene Billings, PA-C Orthopedic Surgery [facility] [place] [phone]

## 2023-08-12 NOTE — Anesthesia Procedure Notes (Signed)
Anesthesia Regional Block: Adductor canal block   Pre-Anesthetic Checklist: , timeout performed,  Correct Patient, Correct Site, Correct Laterality,  Correct Procedure, Correct Position, site marked,  Risks and benefits discussed,  Surgical consent,  Pre-op evaluation,  At surgeon's request and post-op pain management  Laterality: Left  Prep: chloraprep       Needles:  Injection technique: Single-shot  Needle Type: Echogenic Needle     Needle Length: 9cm  Needle Gauge: 21     Additional Needles:   Narrative:  Start time: 08/12/2023 7:02 AM End time: 08/12/2023 7:08 AM Injection made incrementally with aspirations every 5 mL.  Performed by: Personally  Anesthesiologist: Achille Rich, MD  Additional Notes: Pt tolerated the procedure well.

## 2023-08-12 NOTE — Plan of Care (Signed)
  Problem: Education: Goal: Knowledge of General Education information will improve Description Including pain rating scale, medication(s)/side effects and non-pharmacologic comfort measures Outcome: Progressing   Problem: Health Behavior/Discharge Planning: Goal: Ability to manage health-related needs will improve Outcome: Progressing   

## 2023-08-12 NOTE — Plan of Care (Signed)

## 2023-08-12 NOTE — Anesthesia Postprocedure Evaluation (Signed)
Anesthesia Post Note  Patient: BERNELL SEKHON  Procedure(s) Performed: TOTAL KNEE ARTHROPLASTY (Left: Knee)     Patient location during evaluation: PACU Anesthesia Type: MAC, Spinal and Regional Level of consciousness: oriented and awake and alert Pain management: pain level controlled Vital Signs Assessment: post-procedure vital signs reviewed and stable Respiratory status: spontaneous breathing, respiratory function stable and patient connected to nasal cannula oxygen Cardiovascular status: blood pressure returned to baseline and stable Postop Assessment: no headache, no backache and no apparent nausea or vomiting Anesthetic complications: no   No notable events documented.  Last Vitals:  Vitals:   08/12/23 1359 08/12/23 1825  BP: (!) 138/51 (!) 152/49  Pulse: 74 (!) 58  Resp: 17 16  Temp: 36.9 C 37.1 C  SpO2: 96% 93%    Last Pain:  Vitals:   08/12/23 2100  TempSrc:   PainSc: 8                  Erice Ahles S

## 2023-08-13 ENCOUNTER — Encounter (HOSPITAL_COMMUNITY): Payer: Self-pay | Admitting: Orthopedic Surgery

## 2023-08-13 DIAGNOSIS — M1712 Unilateral primary osteoarthritis, left knee: Secondary | ICD-10-CM | POA: Diagnosis not present

## 2023-08-13 LAB — CBC
HCT: 28.8 % — ABNORMAL LOW (ref 39.0–52.0)
Hemoglobin: 9.2 g/dL — ABNORMAL LOW (ref 13.0–17.0)
MCH: 29.7 pg (ref 26.0–34.0)
MCHC: 31.9 g/dL (ref 30.0–36.0)
MCV: 92.9 fL (ref 80.0–100.0)
Platelets: 319 10*3/uL (ref 150–400)
RBC: 3.1 MIL/uL — ABNORMAL LOW (ref 4.22–5.81)
RDW: 15.2 % (ref 11.5–15.5)
WBC: 14.8 10*3/uL — ABNORMAL HIGH (ref 4.0–10.5)
nRBC: 0 % (ref 0.0–0.2)

## 2023-08-13 LAB — BASIC METABOLIC PANEL
Anion gap: 11 (ref 5–15)
BUN: 62 mg/dL — ABNORMAL HIGH (ref 8–23)
CO2: 21 mmol/L — ABNORMAL LOW (ref 22–32)
Calcium: 8.5 mg/dL — ABNORMAL LOW (ref 8.9–10.3)
Chloride: 104 mmol/L (ref 98–111)
Creatinine, Ser: 1.88 mg/dL — ABNORMAL HIGH (ref 0.61–1.24)
GFR, Estimated: 38 mL/min — ABNORMAL LOW (ref >60)
Glucose, Bld: 171 mg/dL — ABNORMAL HIGH (ref 70–99)
Potassium: 4.4 mmol/L (ref 3.5–5.1)
Sodium: 136 mmol/L (ref 135–145)

## 2023-08-13 MED ORDER — ASPIRIN 81 MG PO CHEW: 81.0000 mg | CHEWABLE_TABLET | Freq: Two times a day (BID) | ORAL | 0 refills | Status: AC

## 2023-08-13 MED ORDER — OXYCODONE HCL 5 MG PO TABS: 5.0000 mg | ORAL_TABLET | ORAL | 0 refills | Status: AC | PRN

## 2023-08-13 MED ORDER — METHOCARBAMOL 500 MG PO TABS: 500.0000 mg | ORAL_TABLET | Freq: Four times a day (QID) | ORAL | 2 refills | Status: AC | PRN

## 2023-08-13 MED ORDER — SENNA 8.6 MG PO TABS: 2.0000 | ORAL_TABLET | Freq: Every day | ORAL | 0 refills | Status: AC

## 2023-08-13 NOTE — TOC Transition Note (Signed)
Transition of Care [facility]) - Discharge Note   Patient Details  Name: DAOUDA CRESSLER MRN: [id number] Date of Birth: May 29, 1951  [facility]) CM/SW Contact:  Howell Rucks, RN Phone Number: 08/13/2023, 10:09 AM   Clinical Narrative:  Met with pt at bedside to review therapy and DME follow up, pt confirmed OPPT at [facility],  no home DME needs. No TOC needs.      Final next level of care: OP Rehab Barriers to Discharge: No Barriers Identified   Patient Goals and CMS Choice Patient states their goals for this hospitalization and ongoing recovery are:: return home          Discharge Placement                       Discharge Plan and Services Additional resources added to the After Visit Summary for                                       Social Drivers of Health (SDOH) Interventions SDOH Screenings   Food Insecurity: No Food Insecurity (08/12/2023)  Housing: Low Risk  (08/12/2023)  Transportation Needs: No Transportation Needs (08/12/2023)  Utilities: Not At Risk (08/12/2023)  Social Connections: Moderately Integrated (08/12/2023)  Tobacco Use: Medium Risk (08/12/2023)     Readmission Risk Interventions     No data to display

## 2023-08-13 NOTE — Care Management Obs Status (Signed)
MEDICARE OBSERVATION STATUS NOTIFICATION   Patient Details  Name: Dwayne Gomez MRN: [id number] Date of Birth: [birth year]   Medicare Observation Status Notification Given:  Yes    Armanda Heritage, RN 08/13/2023, 12:13 PM

## 2023-08-13 NOTE — Progress Notes (Signed)
Physical Therapy Treatment Patient Details Name: EDU CRESTO MRN: [id number] DOB: 1951-02-16 Today's Date: 08/13/2023   History of Present Illness Pt s/p L TKR and with hx of CKD, DM, gout, anemia, htn, R R TSR (3/24) and R TKR (9/29)    PT Comments  Pt motivated and progressing well with mobility.  Pt up to ambulate increased distance in hall, negotiated stairs, and performed HEP with written instruction provided.  Pt eager for dc home this date.    If plan is discharge home, recommend the following: A little help with walking and/or transfers;A little help with bathing/dressing/bathroom;Assistance with cooking/housework;Assist for transportation;Help with stairs or ramp for entrance   Can travel by private vehicle        Equipment Recommendations  None recommended by PT    Recommendations for Other Services       Precautions / Restrictions Precautions Precautions: Fall;Knee Restrictions Weight Bearing Restrictions Per Provider Order: No Other Position/Activity Restrictions: WBAT     Mobility  Bed Mobility Overal bed mobility: Needs Assistance Bed Mobility: Supine to Sit     Supine to sit: Supervision     General bed mobility comments: for safety    Transfers Overall transfer level: Needs assistance Equipment used: Rolling walker (2 wheels) Transfers: Sit to/from Stand Sit to Stand: Contact guard assist, Supervision           General transfer comment: cues for LE management and use of UEs to self assist    Ambulation/Gait Ambulation/Gait assistance: Contact guard assist, Supervision Gait Distance (Feet): 140 Feet Assistive device: Rolling walker (2 wheels) Gait Pattern/deviations: Step-to pattern, Step-through pattern, Decreased step length - right, Decreased step length - left, Shuffle, Trunk flexed Gait velocity: decr     General Gait Details: cues for sequence, posture and position from RW   Stairs Stairs: Yes Stairs assistance: Contact  guard assist Stair Management: One rail Left, Forwards, With cane, Step to pattern Number of Stairs: 3 General stair comments: min cues for sequence and cane placement   Wheelchair Mobility     Tilt Bed    Modified Rankin (Stroke Patients Only)       Balance Overall balance assessment: Mild deficits observed, not formally tested                                          Cognition Arousal: Alert Behavior During Therapy: WFL for tasks assessed/performed Overall Cognitive Status: Within Functional Limits for tasks assessed                                          Exercises Total Joint Exercises Ankle Circles/Pumps: AROM, Both, 15 reps, Supine Quad Sets: AROM, Both, 10 reps, Supine Heel Slides: AAROM, Left, 15 reps, Supine Straight Leg Raises: AAROM, AROM, Left, 10 reps, Supine Long Arc Quad: AAROM, AROM, Left, 10 reps, Seated    General Comments        Pertinent Vitals/Pain Pain Assessment Pain Assessment: 0-10 Pain Score: 4  Pain Location: L knee Pain Descriptors / Indicators: Aching, Sore Pain Intervention(s): Limited activity within patient's tolerance, Monitored during session, Premedicated before session, Ice applied    Home Living  Prior Function            PT Goals (current goals can now be found in the care plan section) Acute Rehab PT Goals Patient Stated Goal: Regain IND PT Goal Formulation: With patient Time For Goal Achievement: 08/19/23 Potential to Achieve Goals: Good Progress towards PT goals: Progressing toward goals    Frequency    7X/week      PT Plan      Co-evaluation              AM-PAC PT "6 Clicks" Mobility   Outcome Measure  Help needed turning from your back to your side while in a flat bed without using bedrails?: A Little Help needed moving from lying on your back to sitting on the side of a flat bed without using bedrails?: A Little Help  needed moving to and from a bed to a chair (including a wheelchair)?: A Little Help needed standing up from a chair using your arms (e.g., wheelchair or bedside chair)?: A Little Help needed to walk in hospital room?: A Little Help needed climbing 3-5 steps with a railing? : A Little 6 Click Score: 18    End of Session Equipment Utilized During Treatment: Gait belt Activity Tolerance: Patient tolerated treatment well Patient left: in chair;with call bell/phone within reach;with chair alarm set Nurse Communication: Mobility status PT Visit Diagnosis: Difficulty in walking, not elsewhere classified (R26.2)     Time: 8295-6213 PT Time Calculation (min) (ACUTE ONLY): 37 min  Charges:    $Gait Training: 8-22 mins $Therapeutic Exercise: 8-22 mins PT General Charges $$ ACUTE PT VISIT: 1 Visit                     Mauro Kaufmann PT Acute Rehabilitation Services Pager [phone] Office [phone]    Lochlin Eppinger 08/13/2023, 1:24 PM

## 2023-08-13 NOTE — Progress Notes (Signed)
   Subjective: 1 Day Post-Op Procedure(s) (LRB): TOTAL KNEE ARTHROPLASTY (Left) Patient reports pain as mild.   Patient seen in rounds with Dr. Charlann Boxer. Patient is well, and has had no acute complaints or problems. No acute events overnight. Foley catheter removed. Patient ambulated 90 feet with PT.  We will start therapy today.   Objective: Vital signs in last 24 hours: Temp:  [97.3 F (36.3 C)-98.8 F (37.1 C)] 98.1 F (36.7 C) (01/17 0643) Pulse Rate:  [52-76] 73 (01/17 0643) Resp:  [10-17] 15 (01/17 0643) BP: (113-152)/(47-69) 127/69 (01/17 0643) SpO2:  [93 %-100 %] 94 % (01/17 0643) FiO2 (%):  [21 %] 21 % (01/16 2258)  Intake/Output from previous day:  Intake/Output Summary (Last 24 hours) at 08/13/2023 0816 Last data filed at 08/13/2023 0600 Gross per 24 hour  Intake 1670 ml  Output 2250 ml  Net -580 ml     Intake/Output this shift: No intake/output data recorded.  Labs: Recent Labs    08/13/23 0348  HGB 9.2*   Recent Labs    08/13/23 0348  WBC 14.8*  RBC 3.10*  HCT 28.8*  PLT 319   Recent Labs    08/13/23 0348  NA 136  K 4.4  CL 104  CO2 21*  BUN 62*  CREATININE 1.88*  GLUCOSE 171*  CALCIUM 8.5*   No results for input(s): "LABPT", "INR" in the last 72 hours.  Exam: General - Patient is Alert and Oriented Extremity - Neurologically intact Sensation intact distally Intact pulses distally Dorsiflexion/Plantar flexion intact Dressing - dressing C/D/I Motor Function - intact, moving foot and toes well on exam.   Past Medical History:  Diagnosis Date   Anemia    Arthritis    gout   Chronic kidney disease    Diabetes mellitus without complication (HCC)    type 2   Family history of adverse reaction to anesthesia    Father- hard time waking up after anesthesia   Gout    High cholesterol    Hypertension    Sleep apnea    cpap    Assessment/Plan: 1 Day Post-Op Procedure(s) (LRB): TOTAL KNEE ARTHROPLASTY (Left) Principal Problem:    S/P total knee arthroplasty, left  Estimated body mass index is 37.38 kg/m as calculated from the following:   Height as of this encounter: 5\' 11"  (1.803 m).   Weight as of this encounter: 121.6 kg. Advance diet Up with therapy D/C IV fluids   Patient's anticipated LOS is less than 2 midnights, meeting these requirements: - Younger than 65 - Lives within 1 hour of care - Has a competent adult at home to recover with post-op recover - NO history of  - Chronic pain requiring opiods  - Diabetes  - Coronary Artery Disease  - Heart failure  - Heart attack  - Stroke  - DVT/VTE  - Cardiac arrhythmia  - Respiratory Failure/COPD  - Renal failure  - Anemia  - Advanced Liver disease     DVT Prophylaxis - Aspirin Weight bearing as tolerated.  Hgb stable at 9.2 this AM. Cr. Near baseline at 1.88  Plan is to go Home after hospital stay. Plan for discharge today following 1-2 sessions of PT as long as they are meeting their goals. Patient is scheduled for OPPT. Follow up in the office in 2 weeks.   Rosalene Billings, PA-C Orthopedic Surgery [phone] 08/13/2023, 8:16 AM

## 2023-08-13 NOTE — Care Management Obs Status (Signed)
MEDICARE OBSERVATION STATUS NOTIFICATION   Patient Details  Name: Dwayne Gomez MRN: [id number] Date of Birth: 1951-02-25   Medicare Observation Status Notification Given:       Howell Rucks, RN 08/13/2023, 9:52 AM

## 2023-08-15 NOTE — Discharge Summary (Signed)
Patient ID: Dwayne Gomez MRN: [id number] DOB/AGE: July 22, 1951 73 y.o.  Admit date: 08/12/2023 Discharge date: 08/13/2023  Admission Diagnoses:  Left knee osteoarthritis  Discharge Diagnoses:  Principal Problem:   S/P total knee arthroplasty, left   Past Medical History:  Diagnosis Date   Anemia    Arthritis    gout   Chronic kidney disease    Diabetes mellitus without complication (HCC)    type 2   Family history of adverse reaction to anesthesia    Father- hard time waking up after anesthesia   Gout    High cholesterol    Hypertension    Sleep apnea    cpap    Surgeries: Procedure(s): TOTAL KNEE ARTHROPLASTY on 08/12/2023   Consultants:   Discharged Condition: Improved  Hospital Course: Dwayne Gomez is an 73 y.o. male who was admitted 08/12/2023 for operative treatment ofS/P total knee arthroplasty, left. Patient has severe unremitting pain that affects sleep, daily activities, and work/hobbies. After pre-op clearance the patient was taken to the operating room on 08/12/2023 and underwent  Procedure(s): TOTAL KNEE ARTHROPLASTY.    Patient was given perioperative antibiotics:  Anti-infectives (From admission, onward)    Start     Dose/Rate Route Frequency Ordered Stop   08/12/23 1330  ceFAZolin (ANCEF) IVPB 2g/100 mL premix        2 g 200 mL/hr over 30 Minutes Intravenous Every 6 hours 08/12/23 1026 08/12/23 2200   08/12/23 0600  ceFAZolin (ANCEF) 3 g in sodium chloride 0.9 % 100 mL IVPB        3 g 200 mL/hr over 30 Minutes Intravenous On call to O.R. 08/12/23 0538 08/12/23 1037        Patient was given sequential compression devices, early ambulation, and chemoprophylaxis to prevent DVT. Patient worked with PT and was meeting their goals regarding safe ambulation and transfers.  Patient benefited maximally from hospital stay and there were no complications.    Recent vital signs: No data found.   Recent laboratory studies:  Recent Labs     08/13/23 0348  WBC 14.8*  HGB 9.2*  HCT 28.8*  PLT 319  NA 136  K 4.4  CL 104  CO2 21*  BUN 62*  CREATININE 1.88*  GLUCOSE 171*  CALCIUM 8.5*     Discharge Medications:   Allergies as of 08/13/2023       Reactions   Ace Inhibitors Other (See Comments)   ACUTE KIDNEY INJURY AND HYPERKALEMIA    Fenofibrate    Interferes with kidneys   Spironolactone    Interferes with kidneys         Medication List     TAKE these medications    acetaminophen 500 MG tablet Commonly known as: TYLENOL Take 500 mg by mouth every 6 (six) hours as needed for moderate pain (pain score 4-6).   allopurinol 300 MG tablet Commonly known as: ZYLOPRIM Take 300 mg by mouth daily.   aspirin 81 MG chewable tablet Chew 1 tablet (81 mg total) by mouth 2 (two) times daily for 28 days.   atorvastatin 40 MG tablet Commonly known as: LIPITOR Take 40 mg by mouth every evening.   diltiazem 360 MG 24 hr capsule Commonly known as: CARDIZEM CD Take 360 mg by mouth daily.   Fish Oil 1000 MG Caps Take 1,000 mg by mouth daily.   furosemide 40 MG tablet Commonly known as: LASIX Take 40-80 mg by mouth See admin instructions. Take 80 mg in the morning  and 40 mg in the afternoon   hydrALAZINE 25 MG tablet Commonly known as: APRESOLINE Take 50 mg by mouth 2 (two) times daily.   iron polysaccharides 150 MG capsule Commonly known as: NIFEREX Take 150 mg by mouth daily.   methocarbamol 500 MG tablet Commonly known as: ROBAXIN Take 1 tablet (500 mg total) by mouth every 6 (six) hours as needed for muscle spasms.   NON FORMULARY Pt uses a cpap nightly   oxyCODONE 5 MG immediate release tablet Commonly known as: Oxy IR/ROXICODONE Take 1 tablet (5 mg total) by mouth every 4 (four) hours as needed for severe pain (pain score 7-10).   senna 8.6 MG Tabs tablet Commonly known as: SENOKOT Take 2 tablets (17.2 mg total) by mouth at bedtime for 14 days.   Toujeo SoloStar 300 UNIT/ML Solostar  Pen Generic drug: insulin glargine (1 Unit Dial) Inject 53 Units into the skin every evening.   TURMERIC CURCUMIN PO Take 1,000 mg by mouth daily.   Vitamin D (Ergocalciferol) 1.25 MG (50000 UNIT) Caps capsule Commonly known as: DRISDOL Take 50,000 Units by mouth every Sunday.               Discharge Care Instructions  (From admission, onward)           Start     Ordered   08/13/23 0000  Change dressing       Comments: Maintain surgical dressing until follow up in the clinic. If the edges start to pull up, may reinforce with tape. If the dressing is no longer working, may remove and cover with gauze and tape, but must keep the area dry and clean.  Call with any questions or concerns.   08/13/23 0818            Diagnostic Studies: No results found.  Disposition: Discharge disposition: 01-Home or Self Care       Discharge Instructions     Call MD / Call 911   Complete by: As directed    If you experience chest pain or shortness of breath, CALL 911 and be transported to the hospital emergency room.  If you develope a fever above 101 F, pus (white drainage) or increased drainage or redness at the wound, or calf pain, call your surgeon's office.   Change dressing   Complete by: As directed    Maintain surgical dressing until follow up in the clinic. If the edges start to pull up, may reinforce with tape. If the dressing is no longer working, may remove and cover with gauze and tape, but must keep the area dry and clean.  Call with any questions or concerns.   Constipation Prevention   Complete by: As directed    Drink plenty of fluids.  Prune juice may be helpful.  You may use a stool softener, such as Colace (over the counter) 100 mg twice a day.  Use MiraLax (over the counter) for constipation as needed.   Diet - low sodium heart healthy   Complete by: As directed    Increase activity slowly as tolerated   Complete by: As directed    Weight bearing as  tolerated with assist device (walker, cane, etc) as directed, use it as long as suggested by your surgeon or therapist, typically at least 4-6 weeks.   Post-operative opioid taper instructions:   Complete by: As directed    POST-OPERATIVE OPIOID TAPER INSTRUCTIONS: It is important to wean off of your opioid medication as soon as possible.  If you do not need pain medication after your surgery it is ok to stop day one. Opioids include: Codeine, Hydrocodone(Norco, Vicodin), Oxycodone(Percocet, oxycontin) and hydromorphone amongst others.  Long term and even short term use of opiods can cause: Increased pain response Dependence Constipation Depression Respiratory depression And more.  Withdrawal symptoms can include Flu like symptoms Nausea, vomiting And more Techniques to manage these symptoms Hydrate well Eat regular healthy meals Stay active Use relaxation techniques(deep breathing, meditating, yoga) Do Not substitute Alcohol to help with tapering If you have been on opioids for less than two weeks and do not have pain than it is ok to stop all together.  Plan to wean off of opioids This plan should start within one week post op of your joint replacement. Maintain the same interval or time between taking each dose and first decrease the dose.  Cut the total daily intake of opioids by one tablet each day Next start to increase the time between doses. The last dose that should be eliminated is the evening dose.      TED hose   Complete by: As directed    Use stockings (TED hose) for 2 weeks on both leg(s).  You may remove them at night for sleeping.        Follow-up Information     Durene Romans, MD. Schedule an appointment as soon as possible for a visit in 2 week(s).   Specialty: Orthopedic Surgery Contact information: [address] [place] [address] [phone]                  Signed: Cassandria Anger 08/15/2023, 7:36 AM

## 2023-08-24 NOTE — Progress Notes (Unsigned)
HPI M former smoker followed for OSA, complicated by  HTN, DM2,  CKD3, Gout, HST 03/03/21- AHI 89.6/ hr, desaturation to 84%, body weight 294 lbs  =============================================================================   08/24/22- 71 yoM former smoker followed for OSA, complicated by  HTN, DM2,  CKD3, Gout, CPAP auto 8-20/ [facility]   ordered 04/02/21 Download-compliance 100%, AHI 8.8/ hr     (residuals are mostly central) Body weight today-292 lbs Covid vax-5 Phizer, 1 Moderna Flu vax had Doing well with CPAP- can't sleep without it. Put his old machine in his camper. Denies changes otherwise.  08/26/23- 72 yoM former smoker followed for OSA, complicated by  HTN, DM2,  CKD3, Gout, CPAP auto 8-20/ [facility]   ordered 04/02/21 Download-compliance 97%, AHI 6.3/hr   mostly centrals Body weight today-267 lbs Had L TKR 08/13/23 Discussed the use of AI scribe software for clinical note transcription with the patient, who gave verbal consent to proceed.  History of Present Illness   The patient, with a history of obstructive sleep apnea managed with CPAP, presents for a post-operative follow-up after recent knee and shoulder surgeries. He reports sleeping well post-operatively, attributing this to the 'pretty good medicines' he received. He is comfortable with his CPAP machine, which is set to a range between eight and twenty. He has a few instances of central apneas, but the obstructive apneas are completely suppressed by the CPAP machine. He denies any breathing troubles and has quit smoking a long time ago. He also mentions forgetting to take his CPAP machine with him for three days, during which he was afraid to go to sleep due to fear of having bad episodes.        ROS-see HPI  + = positive Constitutional:    weight loss, night sweats, fevers, chills, fatigue, lassitude. HEENT:    headaches, difficulty swallowing, tooth/dental problems, sore throat,       sneezing, itching, ear ache, nasal  congestion, post nasal drip, snoring CV:    chest pain, orthopnea, PND, swelling in lower extremities, anasarca,                                   dizziness, palpitations Resp:   shortness of breath with exertion or at rest.                productive cough,   non-productive cough, coughing up of blood.              change in color of mucus.  wheezing.   Skin:    rash or lesions. GI:  No-   heartburn, indigestion, abdominal pain, nausea, vomiting, diarrhea,                 change in bowel habits, loss of appetite GU: dysuria, change in color of urine, no urgency or frequency.   flank pain. MS:   joint pain, stiffness, decreased range of motion, back pain. Neuro-     nothing unusual Psych:  change in mood or affect.  depression or anxiety.   memory loss.  OBJ- Physical Exam General- Alert, Oriented, Affect-appropriate, Distress- none acute, + obese Skin- rash-none, lesions- none, excoriation- none Lymphadenopathy- none Head- atraumatic            Eyes- +disconjugate gaze/ strabismus            Ears- Hearing, canals-normal            Nose- Clear, no-Septal dev,  mucus, polyps, erosion, perforation             Throat- Mallampati IV , mucosa clear , drainage- none, tonsils- atrophic, + teeth Neck- flexible , trachea midline, no stridor , thyroid nl, carotid no bruit Chest - symmetrical excursion , unlabored           Heart/CV- RRR , no murmur , no gallop  , no rub, nl s1 s2                           - JVD- none , edema- none, stasis changes- none, varices- none           Lung- clear to P&A, wheeze- none, cough- none , dullness-none, rub- none           Chest wall-  Abd-  Br/ Gen/ Rectal- Not done, not indicated Extrem- +rolling walker Neuro- grossly intact to observation  Assessment and Plan    Obstructive Sleep Apnea CPAP therapy effective in suppressing obstructive apneas. Patient compliant with therapy and comfortable with current settings (8-20). Noted central apneas, which are not  addressed by CPAP. -Continue current CPAP settings. -Encouraged consistent use of CPAP machine.  Post-Operative Status Recent knee replacement surgery (second knee) two weeks ago. No reported complications or concerns. -Continue current post-operative care plan.  General Health Maintenance No current medications prescribed by this provider. No new concerns raised by the patient. -Annual follow-up or sooner if any concerns arise.

## 2023-08-26 ENCOUNTER — Encounter: Payer: Self-pay | Admitting: Internal Medicine

## 2023-08-26 ENCOUNTER — Ambulatory Visit: Payer: Medicare PPO | Admitting: Internal Medicine

## 2023-08-26 VITALS — BP 146/50 | HR 83 | Temp 98.4°F | Ht 71.0 in | Wt 267.2 lb

## 2023-08-26 DIAGNOSIS — G4733 Obstructive sleep apnea (adult) (pediatric): Secondary | ICD-10-CM | POA: Diagnosis not present

## 2023-08-26 NOTE — Patient Instructions (Signed)
Glad you are doing well- we can continue CPAP 8-20  Please call if we can help

## 2024-08-01 ENCOUNTER — Encounter: Payer: Self-pay | Admitting: Internal Medicine

## 2024-08-25 ENCOUNTER — Ambulatory Visit: Payer: Medicare PPO | Admitting: Internal Medicine

## 2024-09-05 ENCOUNTER — Encounter: Admitting: Primary Care
